# Patient Record
Sex: Female | Born: 1960 | Race: White | Hispanic: No | Marital: Married | Smoking: Former smoker
Health system: Southern US, Community
[De-identification: ages and names within clinical notes are randomized; demographics above are authoritative.]

## PROBLEM LIST (undated history)

## (undated) DIAGNOSIS — N200 Calculus of kidney: Secondary | ICD-10-CM

## (undated) DIAGNOSIS — H25019 Cortical age-related cataract, unspecified eye: Secondary | ICD-10-CM

## (undated) DIAGNOSIS — J449 Chronic obstructive pulmonary disease, unspecified: Secondary | ICD-10-CM

## (undated) DIAGNOSIS — B029 Zoster without complications: Secondary | ICD-10-CM

## (undated) DIAGNOSIS — F32A Depression, unspecified: Secondary | ICD-10-CM

## (undated) DIAGNOSIS — K219 Gastro-esophageal reflux disease without esophagitis: Secondary | ICD-10-CM

## (undated) DIAGNOSIS — J302 Other seasonal allergic rhinitis: Secondary | ICD-10-CM

## (undated) DIAGNOSIS — F329 Major depressive disorder, single episode, unspecified: Secondary | ICD-10-CM

## (undated) DIAGNOSIS — R519 Headache, unspecified: Secondary | ICD-10-CM

## (undated) DIAGNOSIS — R51 Headache: Secondary | ICD-10-CM

## (undated) DIAGNOSIS — E7800 Pure hypercholesterolemia, unspecified: Secondary | ICD-10-CM

## (undated) DIAGNOSIS — M1990 Unspecified osteoarthritis, unspecified site: Secondary | ICD-10-CM

## (undated) DIAGNOSIS — E559 Vitamin D deficiency, unspecified: Secondary | ICD-10-CM

## (undated) DIAGNOSIS — G5600 Carpal tunnel syndrome, unspecified upper limb: Secondary | ICD-10-CM

## (undated) DIAGNOSIS — Z87442 Personal history of urinary calculi: Secondary | ICD-10-CM

## (undated) HISTORY — PX: TUBAL LIGATION: SHX77

## (undated) HISTORY — DX: Calculus of kidney: N20.0

## (undated) HISTORY — PX: ABDOMINAL HYSTERECTOMY: SHX81

## (undated) HISTORY — PX: OTHER SURGICAL HISTORY: SHX169

## (undated) HISTORY — PX: COLONOSCOPY: SHX174

---

## 2008-02-24 ENCOUNTER — Ambulatory Visit: Payer: Self-pay

## 2008-03-18 ENCOUNTER — Ambulatory Visit: Payer: Self-pay

## 2008-04-06 ENCOUNTER — Ambulatory Visit: Payer: Self-pay | Admitting: Orthopaedic Surgery

## 2008-04-14 ENCOUNTER — Ambulatory Visit: Payer: Self-pay | Admitting: Orthopaedic Surgery

## 2009-02-15 ENCOUNTER — Ambulatory Visit: Payer: Self-pay

## 2009-03-21 ENCOUNTER — Ambulatory Visit: Payer: Self-pay | Admitting: Internal Medicine

## 2010-03-22 ENCOUNTER — Ambulatory Visit: Payer: Self-pay

## 2010-08-24 ENCOUNTER — Ambulatory Visit: Payer: Self-pay | Admitting: Gastroenterology

## 2011-04-03 ENCOUNTER — Ambulatory Visit: Payer: Self-pay

## 2012-04-07 ENCOUNTER — Ambulatory Visit: Payer: Self-pay

## 2013-04-13 ENCOUNTER — Ambulatory Visit: Payer: Self-pay

## 2013-10-26 ENCOUNTER — Ambulatory Visit: Payer: Self-pay | Admitting: Gastroenterology

## 2013-10-27 LAB — PATHOLOGY REPORT

## 2014-03-03 ENCOUNTER — Ambulatory Visit: Payer: Self-pay | Admitting: Emergency Medicine

## 2014-04-14 ENCOUNTER — Ambulatory Visit: Payer: Self-pay

## 2014-07-19 ENCOUNTER — Other Ambulatory Visit: Payer: Self-pay | Admitting: Rheumatology

## 2014-07-19 LAB — BODY FLUID CELL COUNT WITH DIFFERENTIAL
BASOS ABS: 0 %
Eosinophil: 0 %
Lymphocytes: 41 %
NUCLEATED CELL COUNT: 290 /mm3
Neutrophils: 12 %
OTHER CELLS BF: 0 %
Other Mononuclear Cells: 47 %

## 2014-07-19 LAB — SYNOVIAL FLUID, CRYSTAL: CRYSTALS, JOINT FLUID: NONE SEEN

## 2015-03-21 ENCOUNTER — Other Ambulatory Visit: Payer: Self-pay | Admitting: Obstetrics and Gynecology

## 2015-03-21 DIAGNOSIS — Z1231 Encounter for screening mammogram for malignant neoplasm of breast: Secondary | ICD-10-CM

## 2015-04-19 ENCOUNTER — Ambulatory Visit
Admission: RE | Admit: 2015-04-19 | Discharge: 2015-04-19 | Disposition: A | Discharge status: Home / Self Care | Payer: Managed Care, Other (non HMO) | Source: Ambulatory Visit | Attending: Obstetrics and Gynecology | Admitting: Obstetrics and Gynecology

## 2015-04-19 DIAGNOSIS — Z1231 Encounter for screening mammogram for malignant neoplasm of breast: Secondary | ICD-10-CM | POA: Diagnosis not present

## 2016-04-01 IMAGING — CR DG FOOT COMPLETE 3+V*L*
1 series · 3 of 3 positions shown · non-contrast
Comparison: None.

CLINICAL DATA: Pain and swelling after twisting the foot.

EXAM:
LEFT FOOT - COMPLETE 3+ VIEW

[Series 1: ap · 0.17mm/px · 3 of 3 slices shown]
[im 1/3]
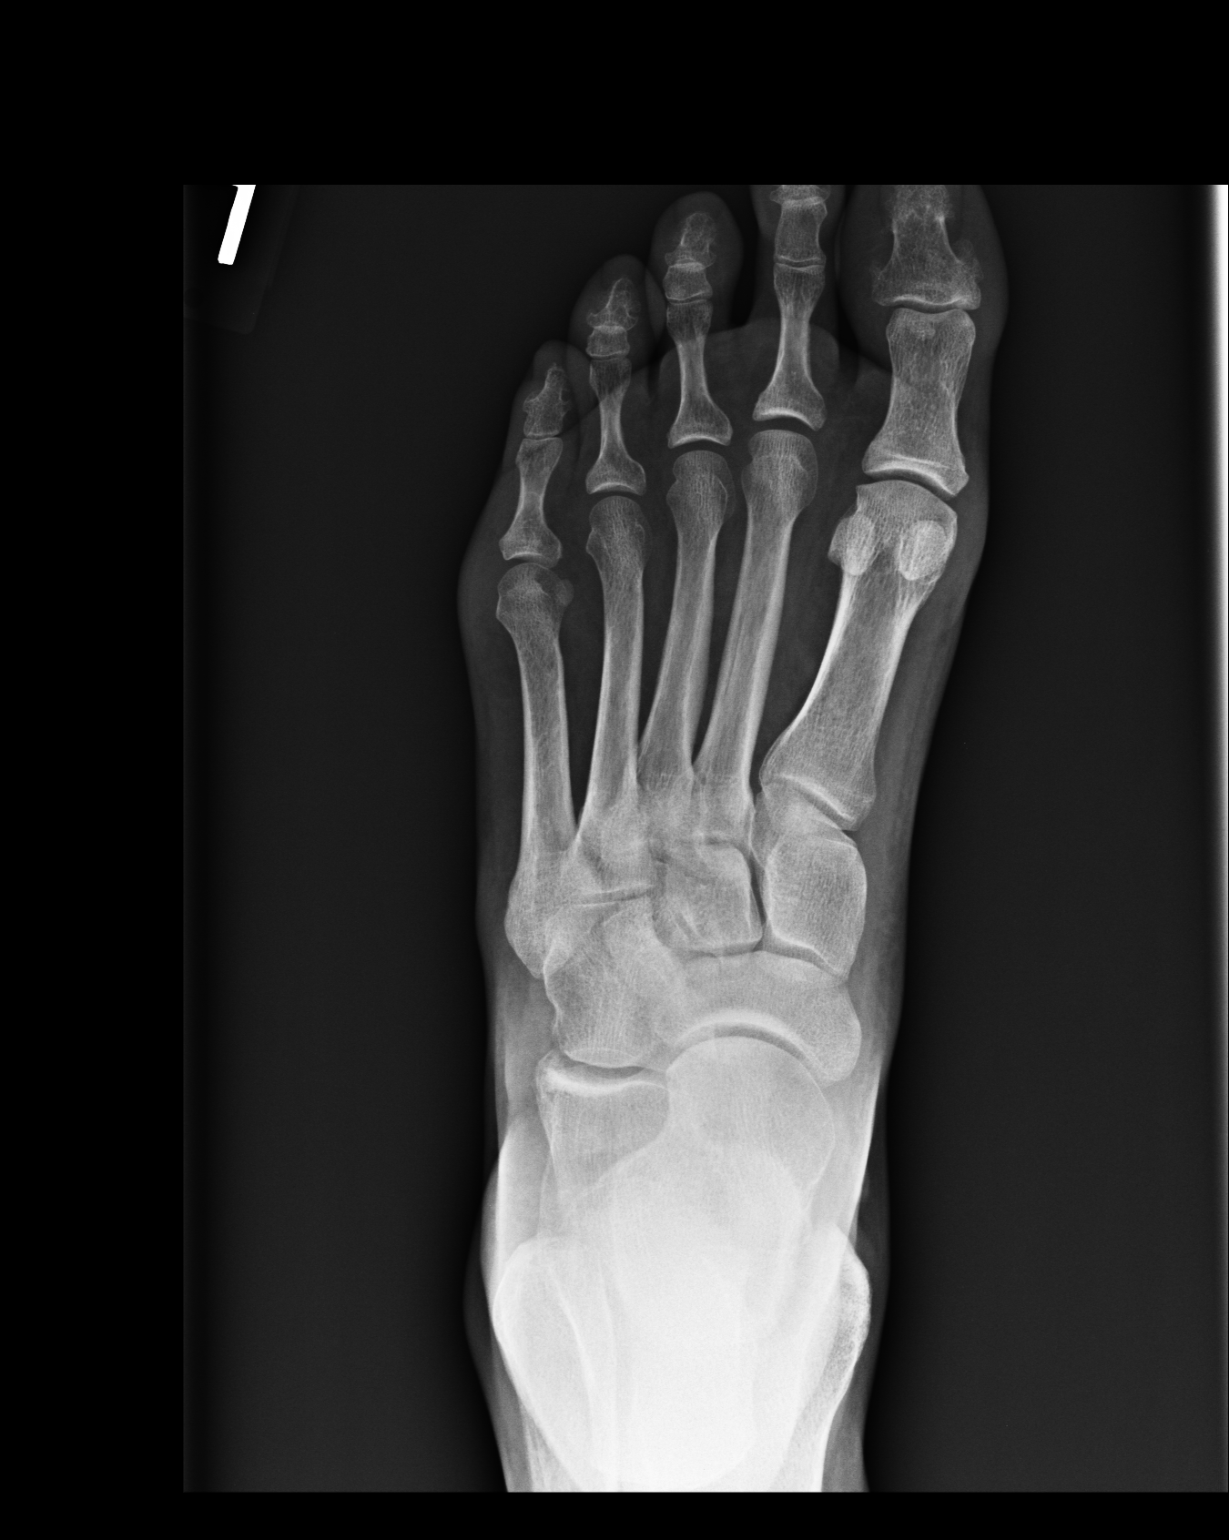
[im 2/3]
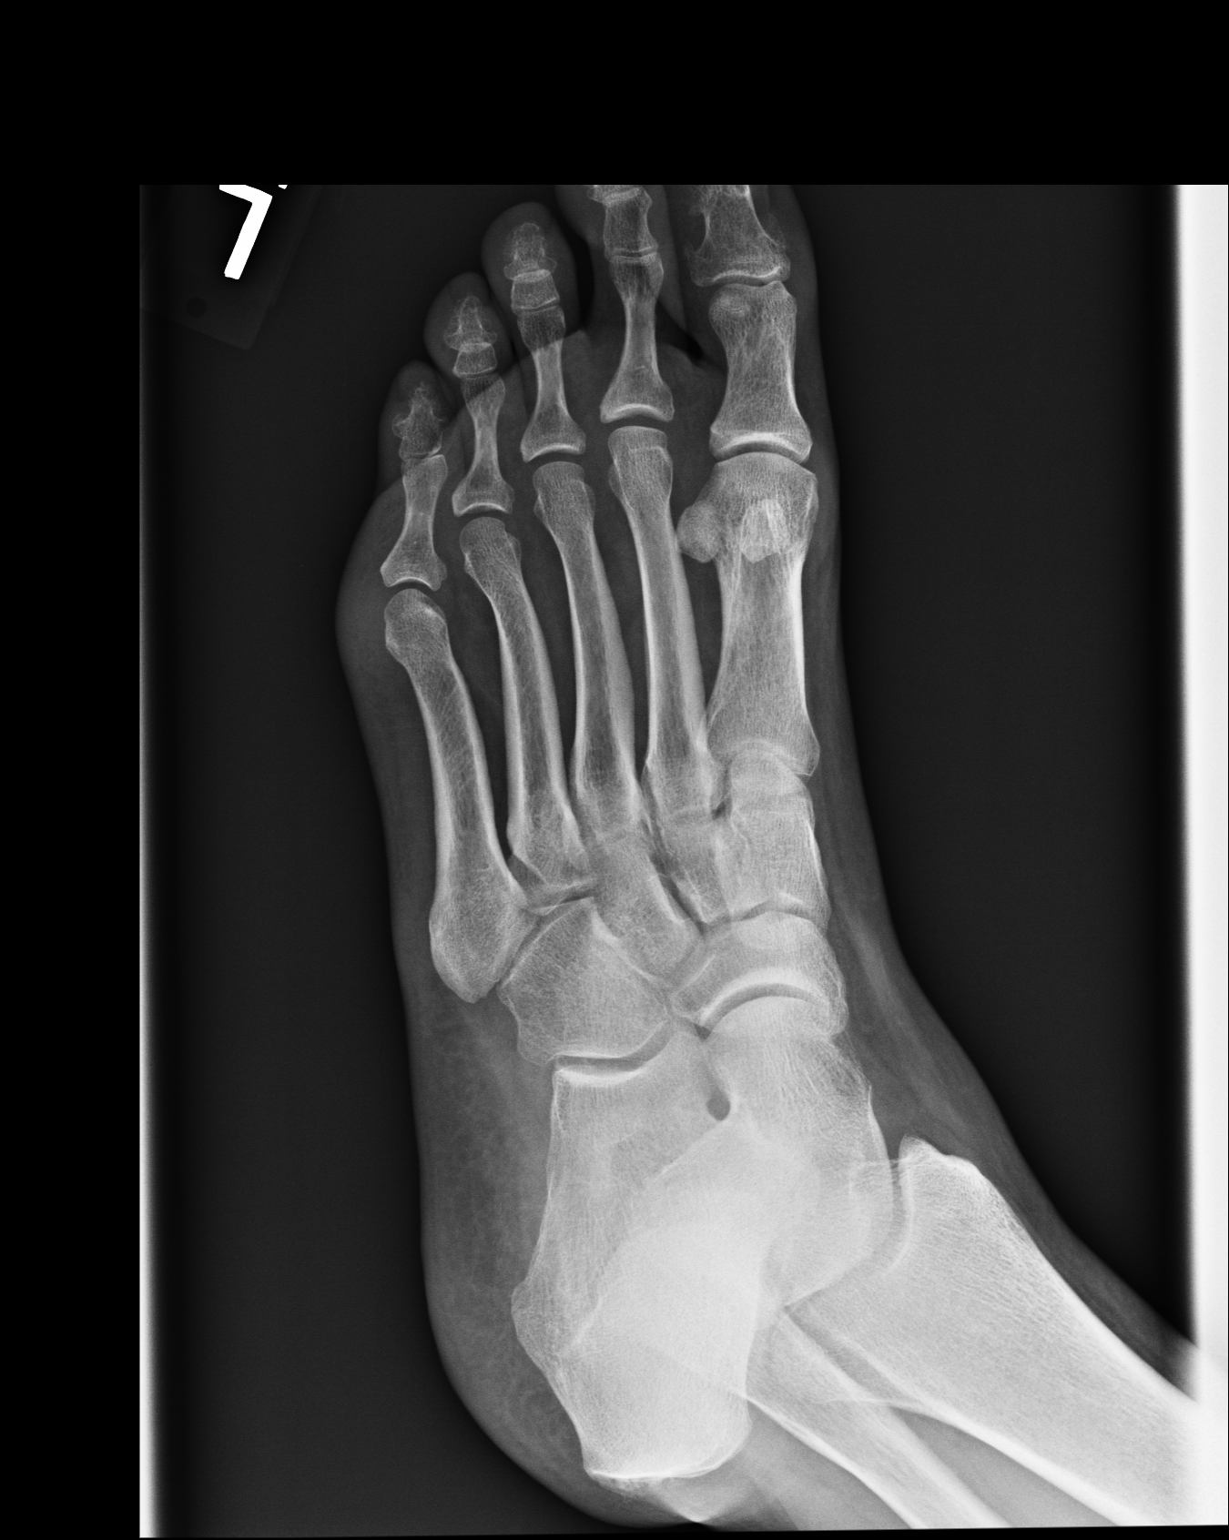
[im 3/3]
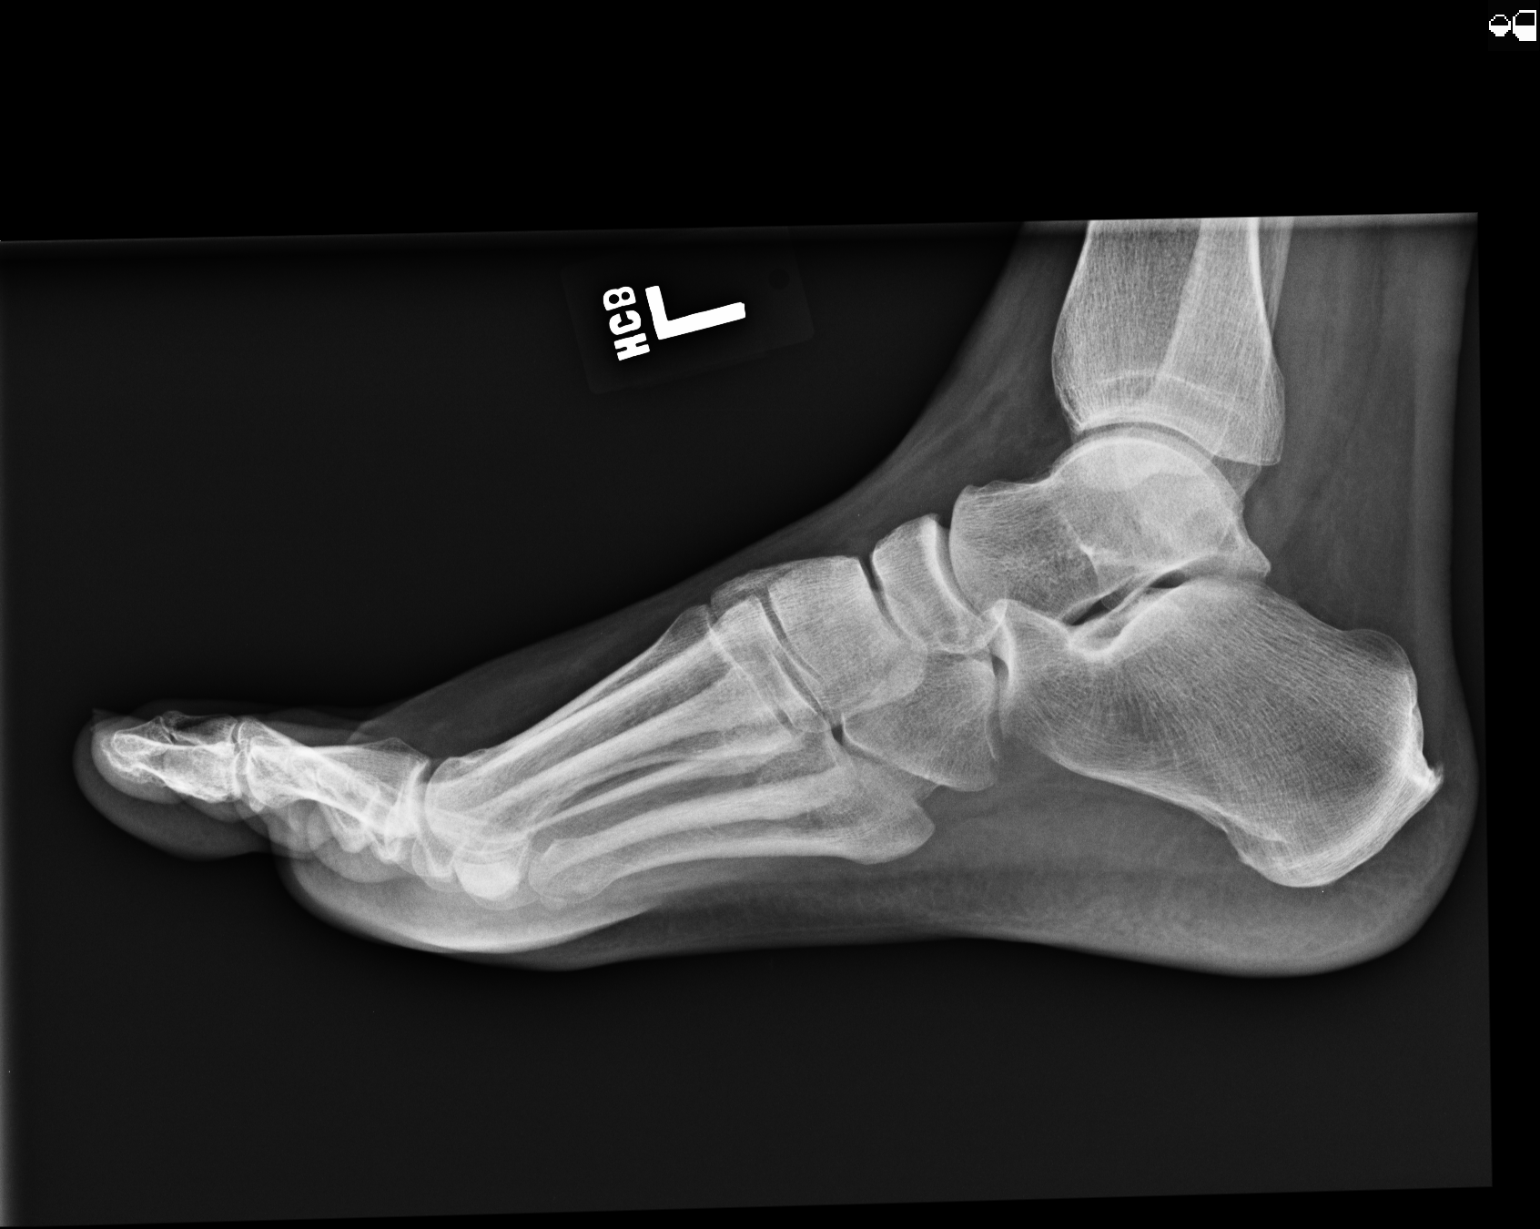

[3 of 3 positions shown; findings below may reference images not displayed]

FINDINGS: There is no evidence of fracture or dislocation. There is no
evidence of arthropathy or other focal bone abnormality. Soft
tissues are unremarkable.
IMPRESSION: Negative.

## 2016-04-01 IMAGING — CR DG ANKLE COMPLETE 3+V*L*
1 series · 3 of 3 positions shown · non-contrast
Comparison: None.

CLINICAL DATA: Twisted ankle.

EXAM:
LEFT ANKLE COMPLETE - 3+ VIEW

[Series 1: ap · 0.17mm/px · 3 of 3 slices shown]
[im 1/3]
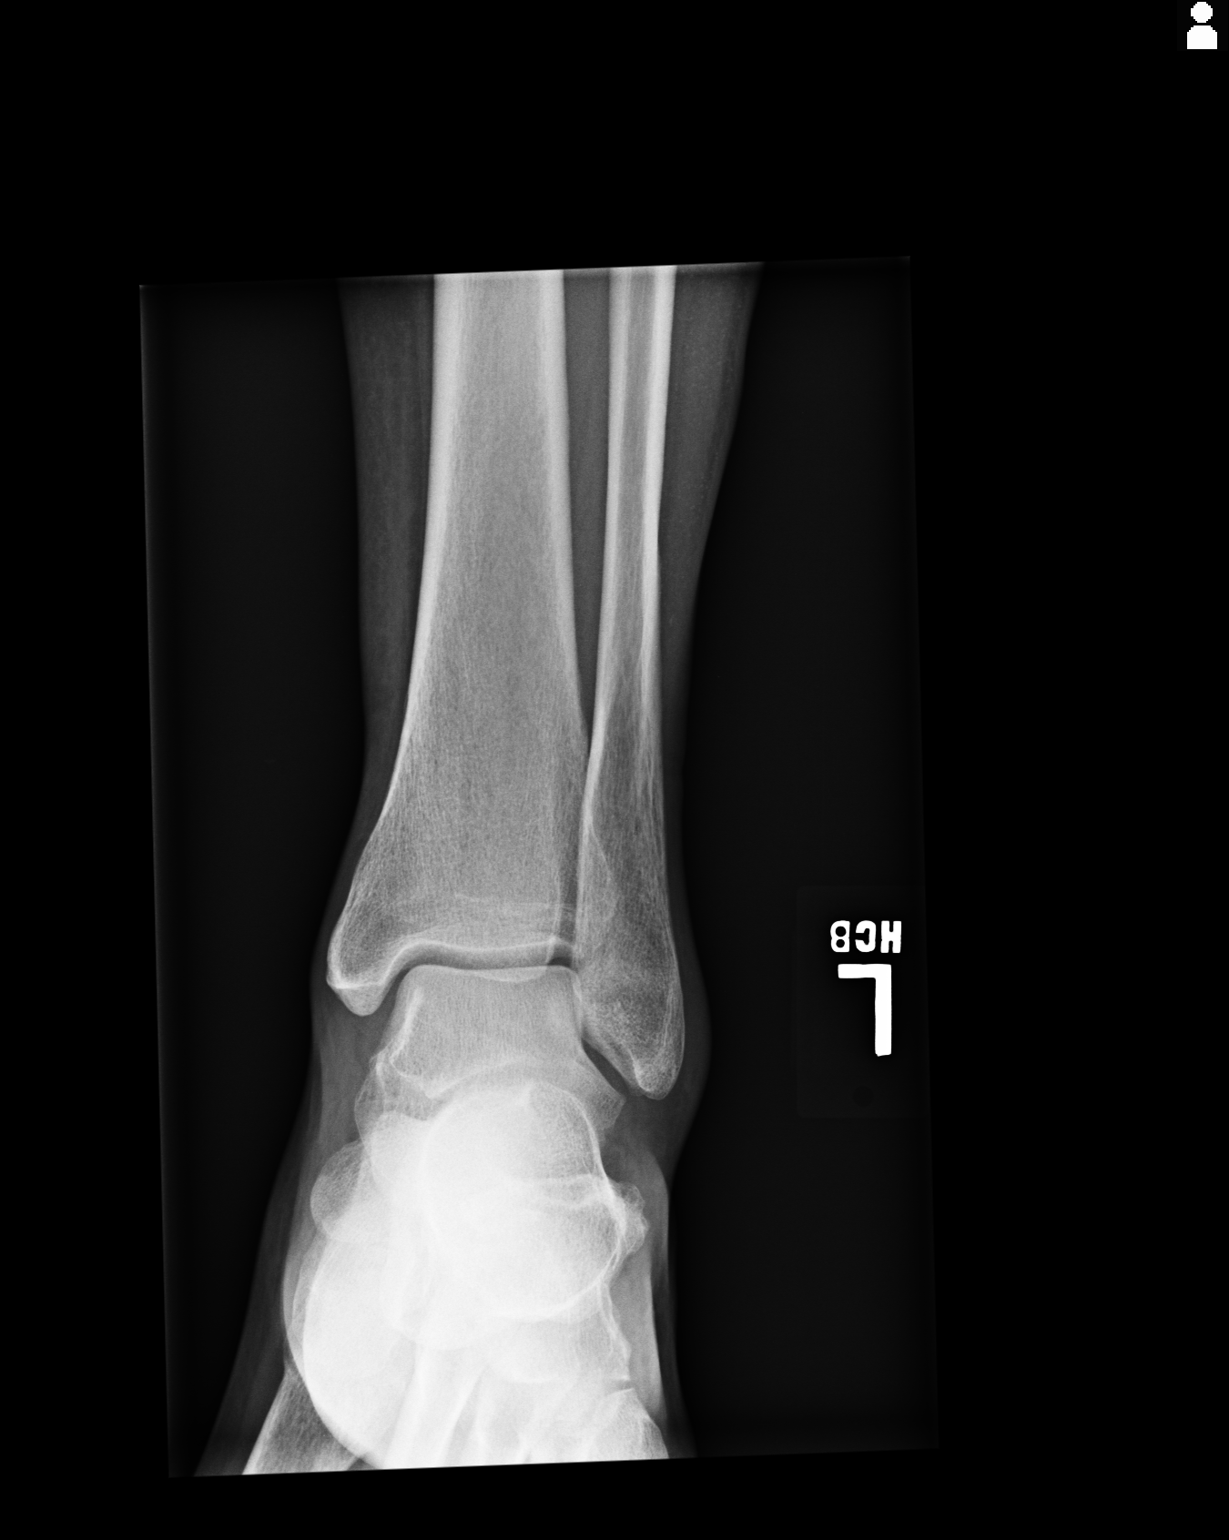
[im 2/3]
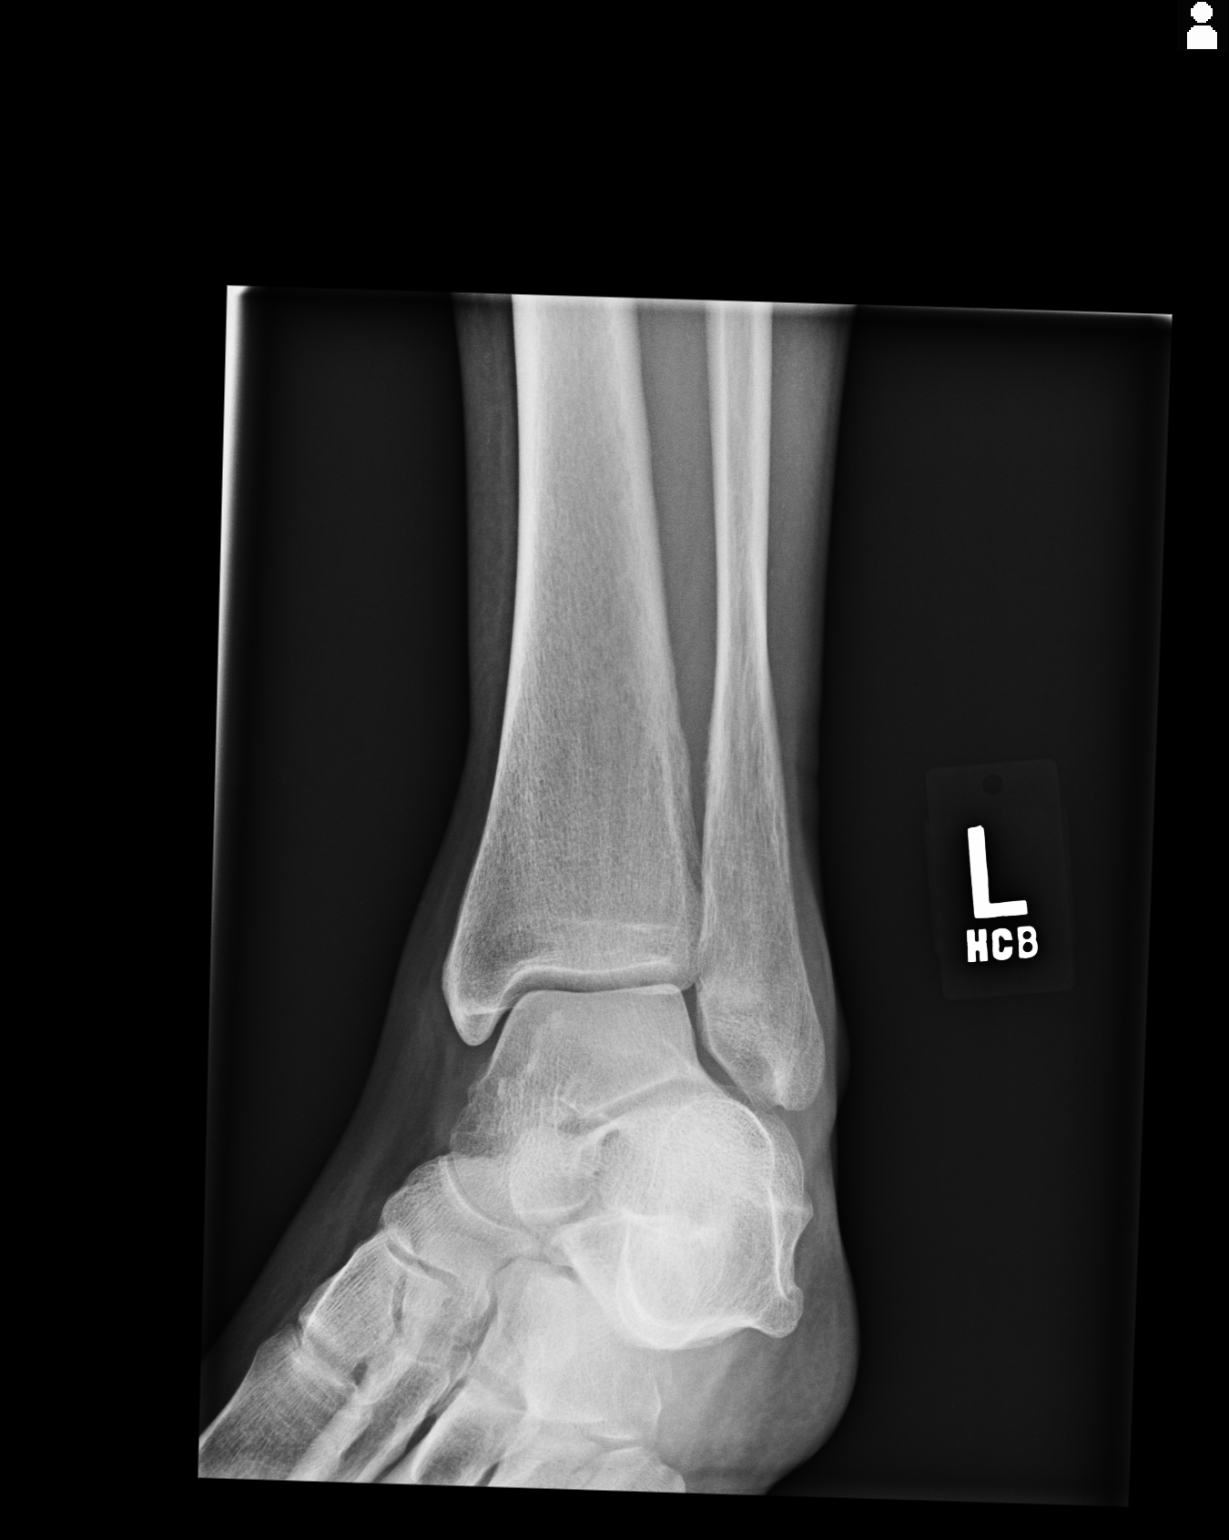
[im 3/3]
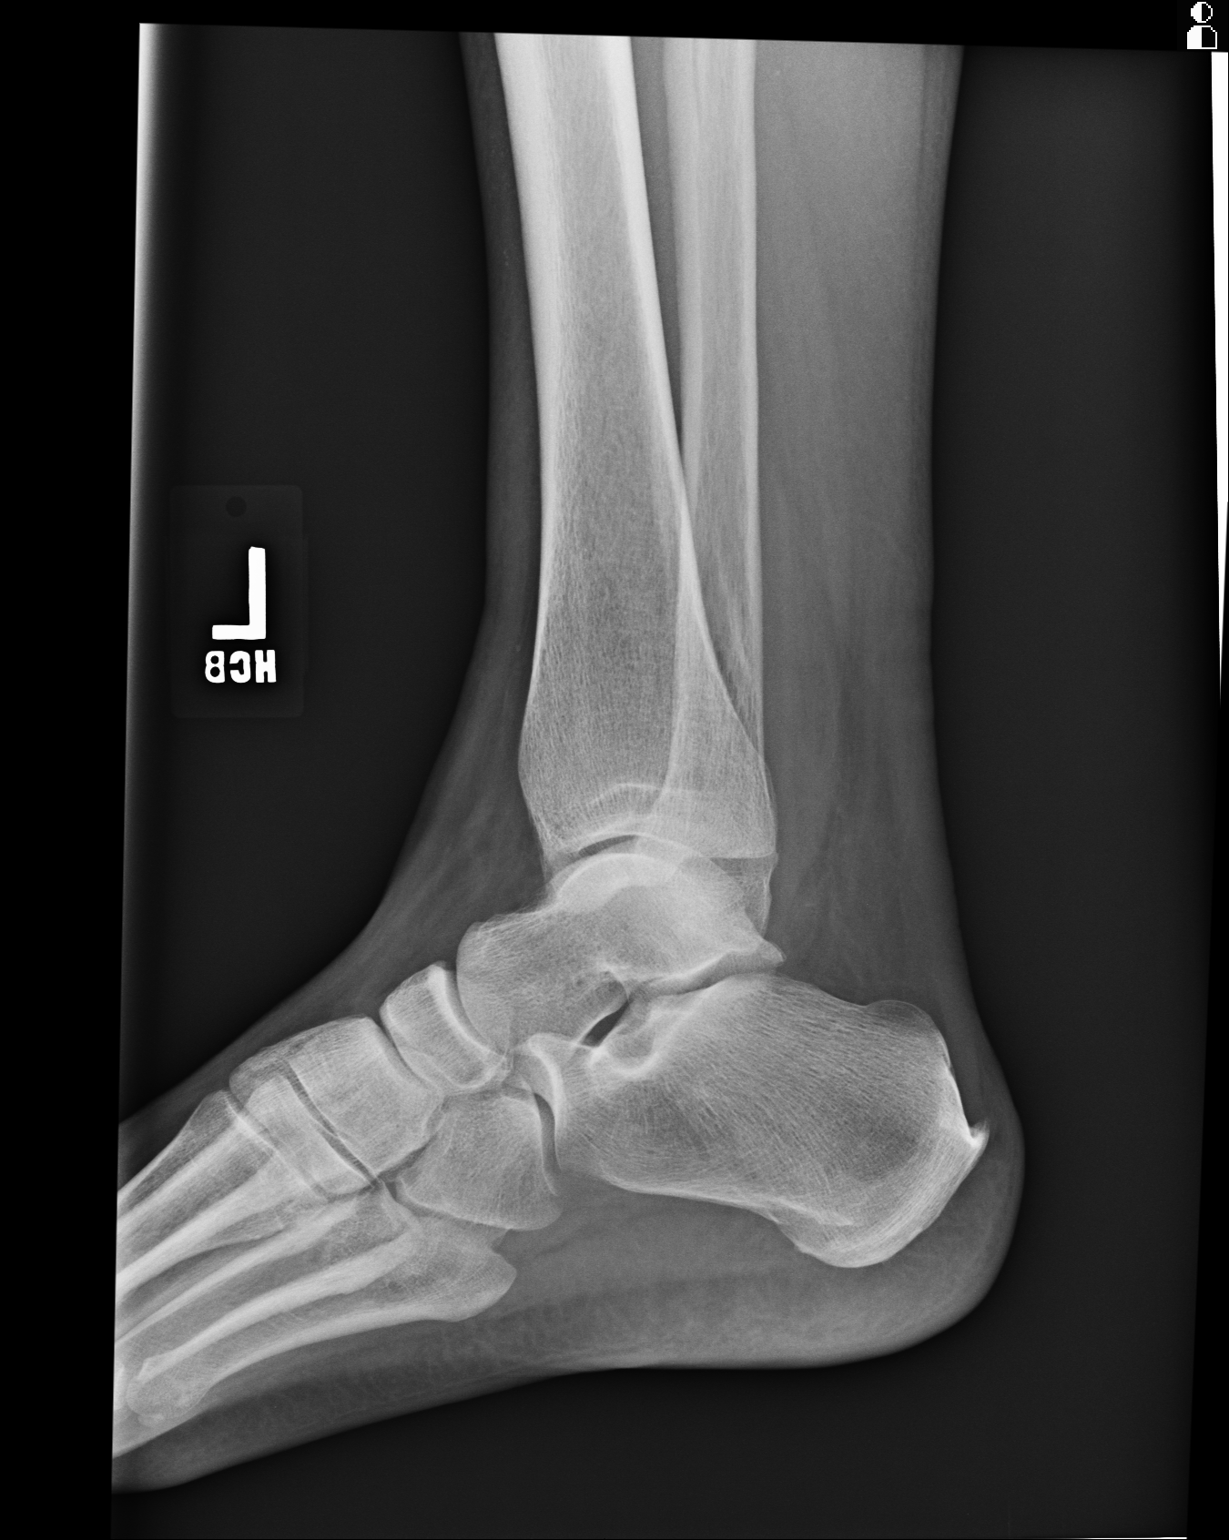

[3 of 3 positions shown; findings below may reference images not displayed]

FINDINGS: The ankle mortise is maintained. No acute ankle fracture or
osteochondral lesion. No definite joint effusion. The mid and
hindfoot bony structures are intact.
IMPRESSION: No acute bony findings.

## 2016-11-27 ENCOUNTER — Other Ambulatory Visit: Payer: Self-pay | Admitting: Family Medicine

## 2016-11-27 DIAGNOSIS — M25431 Effusion, right wrist: Secondary | ICD-10-CM

## 2016-12-02 ENCOUNTER — Ambulatory Visit
Admission: RE | Admit: 2016-12-02 | Discharge: 2016-12-02 | Disposition: A | Discharge status: Home / Self Care | Payer: Commercial Managed Care - PPO | Source: Ambulatory Visit | Attending: Family Medicine | Admitting: Family Medicine

## 2016-12-02 ENCOUNTER — Other Ambulatory Visit: Payer: Self-pay | Admitting: Family Medicine

## 2016-12-02 DIAGNOSIS — M25431 Effusion, right wrist: Secondary | ICD-10-CM

## 2016-12-02 DIAGNOSIS — M67431 Ganglion, right wrist: Secondary | ICD-10-CM | POA: Diagnosis not present

## 2018-01-14 ENCOUNTER — Other Ambulatory Visit: Payer: Self-pay | Admitting: Family Medicine

## 2018-01-14 DIAGNOSIS — Z1231 Encounter for screening mammogram for malignant neoplasm of breast: Secondary | ICD-10-CM

## 2018-10-12 ENCOUNTER — Encounter: Payer: Self-pay | Admitting: *Deleted

## 2018-10-12 ENCOUNTER — Ambulatory Visit: Payer: BLUE CROSS/BLUE SHIELD | Admitting: Anesthesiology

## 2018-10-12 ENCOUNTER — Encounter
Admission: RE | Disposition: A | Discharge status: Home / Self Care | Payer: Self-pay | Source: Home / Self Care | Attending: Gastroenterology

## 2018-10-12 ENCOUNTER — Ambulatory Visit
Admission: RE | Admit: 2018-10-12 | Discharge: 2018-10-12 | Disposition: A | Discharge status: Home / Self Care | Payer: BLUE CROSS/BLUE SHIELD | Attending: Gastroenterology | Admitting: Gastroenterology

## 2018-10-12 DIAGNOSIS — E7800 Pure hypercholesterolemia, unspecified: Secondary | ICD-10-CM | POA: Diagnosis not present

## 2018-10-12 DIAGNOSIS — Z86010 Personal history of colonic polyps: Secondary | ICD-10-CM | POA: Insufficient documentation

## 2018-10-12 DIAGNOSIS — K5730 Diverticulosis of large intestine without perforation or abscess without bleeding: Secondary | ICD-10-CM | POA: Diagnosis not present

## 2018-10-12 DIAGNOSIS — M1990 Unspecified osteoarthritis, unspecified site: Secondary | ICD-10-CM | POA: Diagnosis not present

## 2018-10-12 DIAGNOSIS — D123 Benign neoplasm of transverse colon: Secondary | ICD-10-CM | POA: Diagnosis not present

## 2018-10-12 DIAGNOSIS — Z87891 Personal history of nicotine dependence: Secondary | ICD-10-CM | POA: Diagnosis not present

## 2018-10-12 DIAGNOSIS — K621 Rectal polyp: Secondary | ICD-10-CM | POA: Insufficient documentation

## 2018-10-12 DIAGNOSIS — Z79899 Other long term (current) drug therapy: Secondary | ICD-10-CM | POA: Diagnosis not present

## 2018-10-12 DIAGNOSIS — Z888 Allergy status to other drugs, medicaments and biological substances status: Secondary | ICD-10-CM | POA: Diagnosis not present

## 2018-10-12 DIAGNOSIS — Z791 Long term (current) use of non-steroidal anti-inflammatories (NSAID): Secondary | ICD-10-CM | POA: Diagnosis not present

## 2018-10-12 DIAGNOSIS — Z800 Family history of malignant neoplasm of digestive organs: Secondary | ICD-10-CM | POA: Diagnosis present

## 2018-10-12 DIAGNOSIS — D120 Benign neoplasm of cecum: Secondary | ICD-10-CM | POA: Insufficient documentation

## 2018-10-12 DIAGNOSIS — Z886 Allergy status to analgesic agent status: Secondary | ICD-10-CM | POA: Insufficient documentation

## 2018-10-12 HISTORY — DX: Zoster without complications: B02.9

## 2018-10-12 HISTORY — DX: Major depressive disorder, single episode, unspecified: F32.9

## 2018-10-12 HISTORY — DX: Depression, unspecified: F32.A

## 2018-10-12 HISTORY — DX: Cortical age-related cataract, unspecified eye: H25.019

## 2018-10-12 HISTORY — DX: Other seasonal allergic rhinitis: J30.2

## 2018-10-12 HISTORY — DX: Unspecified osteoarthritis, unspecified site: M19.90

## 2018-10-12 HISTORY — PX: COLONOSCOPY WITH PROPOFOL: SHX5780

## 2018-10-12 HISTORY — DX: Headache, unspecified: R51.9

## 2018-10-12 HISTORY — DX: Headache: R51

## 2018-10-12 HISTORY — DX: Gastro-esophageal reflux disease without esophagitis: K21.9

## 2018-10-12 HISTORY — DX: Pure hypercholesterolemia, unspecified: E78.00

## 2018-10-12 HISTORY — DX: Vitamin D deficiency, unspecified: E55.9

## 2018-10-12 HISTORY — DX: Carpal tunnel syndrome, unspecified upper limb: G56.00

## 2018-10-12 SURGERY — COLONOSCOPY WITH PROPOFOL
Anesthesia: General

## 2018-10-12 MED ORDER — PROPOFOL 500 MG/50ML IV EMUL
INTRAVENOUS | Status: DC | PRN
  Administered 2018-10-12: 120 ug/kg/min via INTRAVENOUS

## 2018-10-12 MED ORDER — SODIUM CHLORIDE 0.9 % IV SOLN
INTRAVENOUS | Status: DC
  Administered 2018-10-12: 13:00:00 via INTRAVENOUS

## 2018-10-12 MED ORDER — PROPOFOL 10 MG/ML IV BOLUS
INTRAVENOUS | Status: DC | PRN
  Administered 2018-10-12: 90 mg via INTRAVENOUS

## 2018-10-12 MED ORDER — SODIUM CHLORIDE 0.9 % IV SOLN: INTRAVENOUS | Status: DC

## 2018-10-12 MED ORDER — PROPOFOL 500 MG/50ML IV EMUL
INTRAVENOUS | Status: AC
  Filled 2018-10-12: qty 50

## 2018-10-12 NOTE — Transfer of Care (Signed)
Immediate Anesthesia Transfer of Care Note  Patient: CALISA LUCKENBAUGH  Procedure(s) Performed: COLONOSCOPY WITH PROPOFOL (N/A )  Patient Location: Endoscopy Unit  Anesthesia Type:General  Level of Consciousness: drowsy and patient cooperative  Airway & Oxygen Therapy: Patient Spontanous Breathing and Patient connected to nasal cannula oxygen  Post-op Assessment: Report given to RN and Post -op Vital signs reviewed and stable  Post vital signs: Reviewed and stable  Last Vitals:  Vitals Value Taken Time  BP 127/73 10/12/2018  2:41 PM  Temp 37.1 C 10/12/2018  2:31 PM  Pulse 82 10/12/2018  2:35 PM  Resp 18 10/12/2018  2:42 PM  SpO2 97 % 10/12/2018  2:35 PM  Vitals shown include unvalidated device data.  Last Pain:  Vitals:   10/12/18 1431  TempSrc: Oral  PainSc: 0-No pain         Complications: No apparent anesthesia complications

## 2018-10-12 NOTE — Anesthesia Post-op Follow-up Note (Signed)
Anesthesia QCDR form completed.        

## 2018-10-12 NOTE — Anesthesia Postprocedure Evaluation (Signed)
Anesthesia Post Note  Patient: Angela Lucas  Procedure(s) Performed: COLONOSCOPY WITH PROPOFOL (N/A )  Patient location during evaluation: PACU Anesthesia Type: General Level of consciousness: awake and alert Pain management: pain level controlled Vital Signs Assessment: post-procedure vital signs reviewed and stable Respiratory status: spontaneous breathing, nonlabored ventilation, respiratory function stable and patient connected to nasal cannula oxygen Cardiovascular status: blood pressure returned to baseline and stable Postop Assessment: no apparent nausea or vomiting Anesthetic complications: no     Last Vitals:  Vitals:   10/12/18 1441 10/12/18 1450  BP: 127/73 136/80  Pulse:    Resp: 17 17  Temp:    SpO2: 100% 99%    Last Pain:  Vitals:   10/12/18 1450  TempSrc:   PainSc: 0-No pain                 Durenda Hurt

## 2018-10-12 NOTE — Op Note (Signed)
[facility] Gastroenterology Patient Name: Angela Lucas Procedure Date: 10/12/2018 1:57 PM MRN: [id number] Account #: [id number] Date of Birth: [birth year] Admit Type: Outpatient Age: 57 Room: [facility] ENDO ROOM 3 Gender: Female Note Status: Finalized Procedure:            Colonoscopy Indications:          Family history of colon cancer in a first-degree                        relative, Personal history of colonic polyps Providers:            Lollie Sails, MD Referring MD:         Sofie Hartigan (Referring MD) Medicines:            Monitored Anesthesia Care Complications:        No immediate complications. Procedure:            Pre-Anesthesia Assessment:                       - ASA Grade Assessment: II - A patient with mild                        systemic disease.                       After obtaining informed consent, the colonoscope was                        passed under direct vision. Throughout the procedure,                        the patient's blood pressure, pulse, and oxygen                        saturations were monitored continuously. The                        Colonoscope was introduced through the anus and                        advanced to the the cecum, identified by appendiceal                        orifice and ileocecal valve. The colonoscopy was                        performed with moderate difficulty due to significant                        looping. Successful completion of the procedure was                        aided by using manual pressure. Findings:      Multiple small and large-mouthed diverticula were found in the sigmoid       colon, descending colon, transverse colon and ascending colon.      A 3 mm polyp was found in the rectum. The polyp was sessile. The polyp       was removed with a cold biopsy forceps. Resection and retrieval were       complete.  A 8 mm polyp was found in the proximal transverse colon. The polyp  was       sessile. The polyp was removed with a cold snare. Resection and       retrieval were complete.      A 7 mm polyp was found in the cecum. The polyp was sessile. The polyp       was removed with a cold snare. Resection and retrieval were complete.      The retroflexed view of the distal rectum and anal verge was normal and       showed no anal or rectal abnormalities.      The digital rectal exam was normal. Impression:           - Diverticulosis in the sigmoid colon, in the                        descending colon, in the transverse colon and in the                        ascending colon.                       - One 3 mm polyp in the rectum, removed with a cold                        biopsy forceps. Resected and retrieved.                       - One 8 mm polyp in the proximal transverse colon,                        removed with a cold snare. Resected and retrieved.                       - One 7 mm polyp in the cecum, removed with a cold                        snare. Resected and retrieved.                       - The distal rectum and anal verge are normal on                        retroflexion view. Recommendation:       - Discharge patient to home.                       - Soft diet today, then advance as tolerated to advance                        diet as tolerated. Procedure Code(s):    --- Professional ---                       [phone], Colonoscopy, flexible; with removal of tumor(s),                        polyp(s), or other lesion(s) by snare technique                       [id number], 59, Colonoscopy,  flexible; with biopsy, single                        or multiple Diagnosis Code(s):    --- Professional ---                       K62.1, Rectal polyp                       D12.3, Benign neoplasm of transverse colon (hepatic                        flexure or splenic flexure)                       D12.0, Benign neoplasm of cecum                       Z80.0, Family history of  malignant neoplasm of                        digestive organs                       Z86.010, Personal history of colonic polyps                       K57.30, Diverticulosis of large intestine without                        perforation or abscess without bleeding CPT copyright 2018 American Medical Association. All rights reserved. The codes documented in this report are preliminary and upon coder review may  be revised to meet current compliance requirements. Lollie Sails, MD 10/12/2018 2:29:50 PM This report has been signed electronically. Number of Addenda: 0 Note Initiated On: 10/12/2018 1:57 PM Scope Withdrawal Time: 0 hours 7 minutes 27 seconds  Total Procedure Duration: 0 hours 19 minutes 51 seconds       [facility]

## 2018-10-12 NOTE — H&P (Signed)
Outpatient short stay form Pre-procedure 10/12/2018 1:49 PM Lollie Sails MD  Primary Physician: Alanson Aly, NP  Reason for visit: Colonoscopy  History of present illness: Patient is a 57 year old female presenting today for colonoscopy.  There is a personal history of adenomatous colon polyps as well as family history of colon cancer in a primary relative.  She tolerated her prep well.  She takes no aspirin or blood thinning agent.    Current Facility-Administered Medications:  .  0.9 %  sodium chloride infusion, , Intravenous, Continuous, Lollie Sails, MD, Last Rate: 20 mL/hr at 10/12/18 1256 .  0.9 %  sodium chloride infusion, , Intravenous, Continuous, Lollie Sails, MD  Medications Prior to Admission  Medication Sig Dispense Refill Last Dose  . acetaminophen (TYLENOL) 325 MG tablet Take 650 mg by mouth every 6 (six) hours as needed.   10/11/2018 at Unknown time  . Cholecalciferol 25 MCG (1000 UT) capsule Take 1,000 Units by mouth daily.   Past Week at Unknown time  . diclofenac sodium (VOLTAREN) 1 % GEL Apply topically 4 (four) times daily.   Past Week at Unknown time  . methylcellulose oral powder Take by mouth daily.   Past Week at Unknown time  . pravastatin (PRAVACHOL) 20 MG tablet Take 20 mg by mouth daily.   10/11/2018 at Unknown time  . valACYclovir (VALTREX) 500 MG tablet Take 500 mg by mouth 2 (two) times daily.   Past Month at Unknown time  . diclofenac (VOLTAREN) 0.1 % ophthalmic solution 4 (four) times daily.   Not Taking at Unknown time     Allergies  Allergen Reactions  . Aspirin   . Nsaids   . Prednisone   . Simvastatin   . Sulfasalazine   . Celebrex [Celecoxib]     Abd. pain     Past Medical History:  Diagnosis Date  . Arthritis   . Carpal tunnel syndrome   . Cataract cortical, senile   . Depression   . GERD (gastroesophageal reflux disease)   . Headache   . Hypercholesterolemia   . Seasonal allergies   . Shingles   . Vitamin  D deficiency     Review of systems:      Physical Exam    Heart and lungs: Rhythm without rub or gallop, lungs are bilaterally clear.    HEENT: Normocephalic atraumatic eyes are anicteric    Other:    Pertinant exam for procedure: Soft nontender nondistended bowel sounds positive normoactive.    Planned proceedures: Colonoscopy and indicated procedures. I have discussed the risks benefits and complications of procedures to include not limited to bleeding, infection, perforation and the risk of sedation and the patient wishes to proceed.    Lollie Sails, MD Gastroenterology 10/12/2018  1:49 PM

## 2018-10-12 NOTE — Anesthesia Preprocedure Evaluation (Signed)
Anesthesia Evaluation  Patient identified by MRN, date of birth, ID band Patient awake    Reviewed: Allergy & Precautions, H&P , NPO status , Patient's Chart, lab work & pertinent test results  Airway Mallampati: III       Dental  (+) Poor Dentition, Loose, Edentulous Upper Very loose lower incisor:   Pulmonary former smoker,           Cardiovascular (-) angina(-) Past MI and (-) Cardiac Stents (-) dysrhythmias      Neuro/Psych  Headaches,  Neuromuscular disease (carpal tunnel) negative psych ROS   GI/Hepatic negative GI ROS, Neg liver ROS, GERD  ,  Endo/Other  negative endocrine ROS  Renal/GU negative Renal ROS  negative genitourinary   Musculoskeletal   Abdominal   Peds  Hematology negative hematology ROS (+)   Anesthesia Other Findings Past Medical History: No date: Arthritis No date: Carpal tunnel syndrome No date: Cataract cortical, senile No date: Depression No date: GERD (gastroesophageal reflux disease) No date: Headache No date: Hypercholesterolemia No date: Seasonal allergies No date: Shingles No date: Vitamin D deficiency  Past Surgical History: No date: ABDOMINAL HYSTERECTOMY No date: arthoscopy knee; Left No date: COLONOSCOPY No date: TUBAL LIGATION  BMI    Body Mass Index:  30.24 kg/m      Reproductive/Obstetrics negative OB ROS                             Anesthesia Physical Anesthesia Plan  ASA: II  Anesthesia Plan: General   Post-op Pain Management:    Induction:   PONV Risk Score and Plan: Propofol infusion and TIVA  Airway Management Planned:   Additional Equipment:   Intra-op Plan:   Post-operative Plan:   Informed Consent: I have reviewed the patients History and Physical, chart, labs and discussed the procedure including the risks, benefits and alternatives for the proposed anesthesia with the patient or authorized representative who has  indicated his/her understanding and acceptance.   Dental Advisory Given  Plan Discussed with: Anesthesiologist, CRNA and Surgeon  Anesthesia Plan Comments:         Anesthesia Quick Evaluation

## 2018-10-14 LAB — SURGICAL PATHOLOGY

## 2019-02-16 ENCOUNTER — Ambulatory Visit
Admission: RE | Admit: 2019-02-16 | Discharge: 2019-02-16 | Disposition: A | Discharge status: Home / Self Care | Payer: Managed Care, Other (non HMO) | Source: Ambulatory Visit | Attending: Family Medicine | Admitting: Family Medicine

## 2019-02-16 ENCOUNTER — Other Ambulatory Visit: Payer: Self-pay

## 2019-02-16 ENCOUNTER — Other Ambulatory Visit: Payer: Self-pay | Admitting: Family Medicine

## 2019-02-16 DIAGNOSIS — R509 Fever, unspecified: Secondary | ICD-10-CM | POA: Insufficient documentation

## 2019-02-16 DIAGNOSIS — R0602 Shortness of breath: Secondary | ICD-10-CM | POA: Insufficient documentation

## 2019-02-16 MED ORDER — IOHEXOL 300 MG/ML  SOLN
75.0000 mL | Freq: Once | INTRAMUSCULAR | Status: AC | PRN
  Administered 2019-02-16: 11:00:00 75 mL via INTRAVENOUS

## 2019-11-03 ENCOUNTER — Ambulatory Visit: Payer: Managed Care, Other (non HMO) | Admitting: Urology

## 2019-11-03 ENCOUNTER — Other Ambulatory Visit: Payer: Self-pay

## 2019-11-03 ENCOUNTER — Ambulatory Visit
Admission: RE | Admit: 2019-11-03 | Discharge: 2019-11-03 | Disposition: A | Discharge status: Home / Self Care | Payer: Managed Care, Other (non HMO) | Source: Ambulatory Visit | Attending: Urology | Admitting: Urology

## 2019-11-03 ENCOUNTER — Other Ambulatory Visit: Payer: Self-pay | Admitting: Urology

## 2019-11-03 ENCOUNTER — Encounter: Payer: Self-pay | Admitting: Urology

## 2019-11-03 VITALS — BP 170/89 | HR 116 | Ht 72.0 in | Wt 225.0 lb

## 2019-11-03 DIAGNOSIS — N200 Calculus of kidney: Secondary | ICD-10-CM

## 2019-11-03 DIAGNOSIS — R1032 Left lower quadrant pain: Secondary | ICD-10-CM | POA: Diagnosis not present

## 2019-11-03 DIAGNOSIS — R310 Gross hematuria: Secondary | ICD-10-CM

## 2019-11-03 MED ORDER — IOHEXOL 300 MG/ML  SOLN
100.0000 mL | Freq: Once | INTRAMUSCULAR | Status: AC | PRN
  Administered 2019-11-03: 16:00:00 100 mL via INTRAVENOUS

## 2019-11-03 NOTE — Progress Notes (Addendum)
11/03/19 2:57 PM   Hanley Hays 1961-05-23 [id number]  Referring provider: Sofie Hartigan, MD [address],  [place] [address]  CC: Left flank pain  HPI: I saw Ms. Angela Lucas in urology clinic in consultation for left flank pain from Dr. Ellison Hughs.  She is a very healthy 59 year old female who reports over a month of intermittent left-sided flank pain.  She feels it is worse when sitting.  She is also had some occasional sharp shooting pain down to the left groin.  She saw her primary care physician with worsening pain and dysuria on 10/12/2019 and a KUB was performed that suggested a 1.6 cm left renal calculus.  These images are unavailable for me to personally review.  Urine culture grew Klebsiella and she was treated with antibiotics at that time with improvement of her UTI symptoms of burning.  She did have some malaise at that time, but denies any fevers or chills.  She denies any history of nephrolithiasis.  She had a small amount of blood in her urine when she was having some flank pain last week.  She also has had some urinary urgency, frequency, and urge incontinence.   PMH: Past Medical History:  Diagnosis Date  . Arthritis   . Carpal tunnel syndrome   . Cataract cortical, senile   . Depression   . GERD (gastroesophageal reflux disease)   . Headache   . Hypercholesterolemia   . Seasonal allergies   . Shingles   . Vitamin D deficiency     Surgical History: Past Surgical History:  Procedure Laterality Date  . ABDOMINAL HYSTERECTOMY    . arthoscopy knee Left   . COLONOSCOPY    . COLONOSCOPY WITH PROPOFOL N/A 10/12/2018   Procedure: COLONOSCOPY WITH PROPOFOL;  Surgeon: Lollie Sails, MD;  Location: [facility] ENDOSCOPY;  Service: Endoscopy;  Laterality: N/A;  . TUBAL LIGATION      Allergies:  Allergies  Allergen Reactions  . Aspirin   . Nsaids   . Prednisone   . Simvastatin   . Sulfasalazine   . Celebrex [Celecoxib]     Abd. pain    Family  History: Family History  Problem Relation Age of Onset  . Breast cancer Maternal Grandmother 50    Social History:  reports that she quit smoking about 11 years ago. She has never used smokeless tobacco. She reports that she does not drink alcohol or use drugs.  ROS: Please see flowsheet from today's date for complete review of systems.  Physical Exam: BP (!) 170/89   Pulse (!) 116   Ht 6' (1.829 m)   Wt 225 lb (102.1 kg)   BMI 30.52 kg/m    Constitutional:  Alert and oriented, No acute distress. Cardiovascular: No clubbing, cyanosis, or edema. Respiratory: Normal respiratory effort, no increased work of breathing. GI: Abdomen is soft, nontender, nondistended, no abdominal masses GU: Left CVA tenderness Lymph: No cervical or inguinal lymphadenopathy. Skin: No rashes, bruises or suspicious lesions. Neurologic: Grossly intact, no focal deficits, moving all 4 extremities. Psychiatric: Normal mood and affect.  Laboratory Data: Reviewed, see HPI  Urinalysis today greater than 30 WBCs, 0 RBCs, no bacteria, no yeast, nitrite negative  Pertinent Imaging: Reviewed, see HPI Unable to obtain outside KUB images to personally review, but report states possible 1.6 cm left renal calculus  Assessment & Plan:   In summary, the patient is a healthy 59 year old female with over a month of left-sided flank pain and groin pain, as well  as a culture positive Klebsiella UTI on 10/12/2019.  Outside KUB from 12/15 reportedly shows a large 1.6 cm left calculus.  We discussed the need for further evaluation with CT to determine stone size, density, and location.  We discussed various treatment options for urolithiasis including observation with or without medical expulsive therapy, shockwave lithotripsy (SWL), ureteroscopy and laser lithotripsy with stent placement, and percutaneous nephrolithotomy.  We discussed that management is based on stone size, location, density, patient co-morbidities, and  patient preference.   Stones <48mm in size have a >80% spontaneous passage rate. Data surrounding the use of tamsulosin for medical expulsive therapy is controversial, but meta analyses suggests it is most efficacious for distal stones between 5-58mm in size. Possible side effects include dizziness/lightheadedness, and retrograde ejaculation.  SWL has a lower stone free rate in a single procedure, but also a lower complication rate compared to ureteroscopy and avoids a stent and associated stent related symptoms. Possible complications include renal hematoma, steinstrasse, and need for additional treatment.  Ureteroscopy with laser lithotripsy and stent placement has a higher stone free rate than SWL in a single procedure, however increased complication rate including possible infection, ureteral injury, bleeding, and stent related morbidity. Common stent related symptoms include dysuria, urgency/frequency, and flank pain.  PCNL is the favored treatment for stones >2cm. It involves a small incision in the flank, with complete fragmentation of stones and removal. It has the highest stone free rate, but also the highest complication rate. Possible complications include bleeding, infection/sepsis, injury to surrounding organs including the pleura, and collecting system injury.   CT ordered, will call with results  Addendum: CT shows a 1 cm nonobstructing left lower pole stone.  There is no hydronephrosis.  We discussed that this may not be the cause of her left-sided flank and abdominal pain, however may be a causative factor with her UTIs.  She desires treatment of the stone.  There is also a 2 cm hypodense subcapsular liver lesion, and will forward to her primary Dr. Ellison Hughs for further evaluation with likely MR.  We will schedule left ureteroscopy, laser lithotripsy, stent placement Follow-up urine culture  A total of 40 minutes were spent face-to-face with the patient, greater than 50% was spent  in patient education, counseling, and coordination of care regarding nephrolithiasis and treatment options.   Billey Co, [facility] [address] [place], [place] [address] [phone]

## 2019-11-03 NOTE — Addendum Note (Signed)
Addended by: Billey Co on: 11/03/2019 03:24 PM   Modules accepted: Orders

## 2019-11-05 LAB — URINALYSIS, COMPLETE
Bilirubin, UA: NEGATIVE
Glucose, UA: NEGATIVE
Ketones, UA: NEGATIVE
Nitrite, UA: NEGATIVE
Protein,UA: NEGATIVE
Specific Gravity, UA: 1.025 (ref 1.005–1.030)
Urobilinogen, Ur: 0.2 mg/dL (ref 0.2–1.0)
pH, UA: 5.5 (ref 5.0–7.5)

## 2019-11-05 LAB — MICROSCOPIC EXAMINATION
Bacteria, UA: NONE SEEN
WBC, UA: >30 /hpf — AB (ref 0–5)

## 2019-11-07 LAB — CULTURE, URINE COMPREHENSIVE

## 2019-11-08 ENCOUNTER — Telehealth: Payer: Self-pay | Admitting: *Deleted

## 2019-11-08 MED ORDER — CIPROFLOXACIN HCL 500 MG PO TABS: 500.0000 mg | ORAL_TABLET | Freq: Two times a day (BID) | ORAL | 0 refills | Status: DC

## 2019-11-08 NOTE — Telephone Encounter (Signed)
Can do cipro 500mg  BID x 7 days, thanks  Nickolas Madrid, MD 11/08/2019

## 2019-11-08 NOTE — Telephone Encounter (Addendum)
-----   Message from Billey Co, MD sent at 11/08/2019  8:16 AM EST ----- Bactrim DS BID x 7 days for infection in urine, need to sterilize urine before surgery   Nickolas Madrid, MD 11/08/2019

## 2019-11-08 NOTE — Telephone Encounter (Signed)
Patient informed-RX sent in to pharmacy requested.  Voiced understanding.

## 2019-11-09 ENCOUNTER — Telehealth: Payer: Self-pay | Admitting: Urology

## 2019-11-09 NOTE — Telephone Encounter (Signed)
She is having ureteroscopy and laser lithotripsy for a renal stone. Can return to work next day without restrictions, but some patients will want to take a few days off and we can write a work note if needed.  Please discuss stent related symptoms of urgency, frequency, dysuria, flank pain with her.   Nickolas Madrid, MD 11/09/2019

## 2019-11-09 NOTE — Telephone Encounter (Signed)
Called pt no answer. Generic message left for pt explaining the information below. Information sent via mychart as well.

## 2019-11-09 NOTE — Telephone Encounter (Signed)
Pt would like a call back to discuss after surgery care and how long she will be out of work, etc..

## 2019-11-12 ENCOUNTER — Other Ambulatory Visit: Payer: Self-pay | Admitting: Urology

## 2019-11-12 DIAGNOSIS — N200 Calculus of kidney: Secondary | ICD-10-CM

## 2019-11-15 ENCOUNTER — Other Ambulatory Visit: Payer: Self-pay | Admitting: Family Medicine

## 2019-11-15 ENCOUNTER — Other Ambulatory Visit: Payer: Self-pay

## 2019-11-15 ENCOUNTER — Other Ambulatory Visit: Payer: Managed Care, Other (non HMO)

## 2019-11-15 DIAGNOSIS — K7689 Other specified diseases of liver: Secondary | ICD-10-CM

## 2019-11-15 DIAGNOSIS — Z1231 Encounter for screening mammogram for malignant neoplasm of breast: Secondary | ICD-10-CM

## 2019-11-15 DIAGNOSIS — N200 Calculus of kidney: Secondary | ICD-10-CM

## 2019-11-15 LAB — URINALYSIS, COMPLETE
Bilirubin, UA: NEGATIVE
Glucose, UA: NEGATIVE
Ketones, UA: NEGATIVE
Leukocytes,UA: NEGATIVE
Nitrite, UA: NEGATIVE
Protein,UA: NEGATIVE
RBC, UA: NEGATIVE
Specific Gravity, UA: <1.005 — ABNORMAL LOW (ref 1.005–1.030)
Urobilinogen, Ur: 0.2 mg/dL (ref 0.2–1.0)
pH, UA: 5 (ref 5.0–7.5)

## 2019-11-15 LAB — MICROSCOPIC EXAMINATION
Bacteria, UA: NONE SEEN
RBC, Urine: NONE SEEN /hpf (ref 0–2)

## 2019-11-17 LAB — CULTURE, URINE COMPREHENSIVE

## 2019-11-23 ENCOUNTER — Encounter: Payer: Self-pay | Admitting: Urology

## 2019-11-23 ENCOUNTER — Other Ambulatory Visit: Payer: Self-pay

## 2019-11-23 ENCOUNTER — Encounter
Admission: RE | Admit: 2019-11-23 | Discharge: 2019-11-23 | Disposition: A | Discharge status: Home / Self Care | Payer: Managed Care, Other (non HMO) | Source: Ambulatory Visit | Attending: Urology | Admitting: Urology

## 2019-11-23 NOTE — Patient Instructions (Signed)
Your procedure is scheduled [id number] 1/29 Report to Day Surgery. To find out your arrival time please call [phone] between 1PM - 3PM on Thurs 1/28.  Remember: Instructions that are not followed completely may result in serious medical risk,  up to and including death, or upon the discretion of your surgeon and anesthesiologist your  surgery may need to be rescheduled.     _X__ 1. Do not eat food after midnight the night before your procedure.                 No gum chewing or hard candies. You may drink clear liquids up to 2 hours                 before you are scheduled to arrive for your surgery- DO not drink clear                 liquids within 2 hours of the start of your surgery.                 Clear Liquids include:  water, apple juice without pulp, clear carbohydrate                 drink such as Clearfast of Gatorade, Black Coffee or Tea (Do not add                 anything to coffee or tea).  __X__2.  On the morning of surgery brush your teeth with toothpaste and water, you                may rinse your mouth with mouthwash if you wish.  Do not swallow any toothpaste of mouthwash.     ___ 3.  No Alcohol for 24 hours before or after surgery.   __ 4.  Do Not Smoke or use e-cigarettes For 24 Hours Prior to Your Surgery.                 Do not use any chewable tobacco products for at least 6 hours prior to                 surgery.  ____  5.  Bring all medications with you on the day of surgery if instructed.   __x__  6.  Notify your doctor if there is any change in your medical condition      (cold, fever, infections).     Do not wear jewelry, make-up, hairpins, clips or nail polish. Do not wear lotions, powders, or perfumes. You may wear deodorant. Do not shave 48 hours prior to surgery. Men may shave face and neck. Do not bring valuables to the hospital.    [facility] is not responsible for any belongings or valuables.  Contacts, dentures or bridgework may not be  worn into surgery. Leave your suitcase in the car. After surgery it may be brought to your room. For patients admitted to the hospital, discharge time is determined by your treatment team.   Patients discharged the day of surgery will not be allowed to drive home.   Please read over the following fact sheets that you were given:    ____ Take these medicines the morning of surgery with A SIP OF WATER:    1. none  2.   3.   4.  5.  6.  ____ Fleet Enema (as directed)   _x___ Shower the night before and the morning of surgery  ____ Use inhalers on  the day of surgery  ____ Stop metformin 2 days prior to surgery    ____ Take 1/2 of usual insulin dose the night before surgery. No insulin the morning          of surgery.   ____ Stop Coumadin/Plavix/aspirin on   __x__ Stop Anti-inflammatories  diclofenac sodium (VOLTAREN) 1 % GEL   May take tylenol but no ibuprofen aspirin of aleve until after surgery   ____ Stop supplements until after surgery.    ____ Bring C-Pap to the hospital.

## 2019-11-24 ENCOUNTER — Other Ambulatory Visit
Admission: RE | Admit: 2019-11-24 | Discharge: 2019-11-24 | Disposition: A | Discharge status: Home / Self Care | Payer: Managed Care, Other (non HMO) | Source: Ambulatory Visit | Attending: Urology | Admitting: Urology

## 2019-11-24 DIAGNOSIS — Z01812 Encounter for preprocedural laboratory examination: Secondary | ICD-10-CM | POA: Diagnosis present

## 2019-11-24 DIAGNOSIS — Z20822 Contact with and (suspected) exposure to covid-19: Secondary | ICD-10-CM | POA: Insufficient documentation

## 2019-11-24 LAB — SARS CORONAVIRUS 2 (TAT 6-24 HRS): SARS Coronavirus 2: NEGATIVE

## 2019-11-26 ENCOUNTER — Ambulatory Visit: Payer: Managed Care, Other (non HMO)

## 2019-11-26 ENCOUNTER — Other Ambulatory Visit: Payer: Self-pay

## 2019-11-26 ENCOUNTER — Ambulatory Visit
Admission: RE | Admit: 2019-11-26 | Discharge: 2019-11-26 | Disposition: A | Discharge status: Home / Self Care | Payer: Managed Care, Other (non HMO) | Source: Ambulatory Visit | Attending: Urology | Admitting: Urology

## 2019-11-26 ENCOUNTER — Encounter: Payer: Self-pay | Admitting: Urology

## 2019-11-26 ENCOUNTER — Encounter
Admission: RE | Disposition: A | Discharge status: Home / Self Care | Payer: Self-pay | Source: Ambulatory Visit | Attending: Urology

## 2019-11-26 DIAGNOSIS — J449 Chronic obstructive pulmonary disease, unspecified: Secondary | ICD-10-CM | POA: Insufficient documentation

## 2019-11-26 DIAGNOSIS — M1990 Unspecified osteoarthritis, unspecified site: Secondary | ICD-10-CM | POA: Insufficient documentation

## 2019-11-26 DIAGNOSIS — Z79899 Other long term (current) drug therapy: Secondary | ICD-10-CM | POA: Diagnosis not present

## 2019-11-26 DIAGNOSIS — N200 Calculus of kidney: Secondary | ICD-10-CM | POA: Diagnosis present

## 2019-11-26 DIAGNOSIS — E7800 Pure hypercholesterolemia, unspecified: Secondary | ICD-10-CM | POA: Insufficient documentation

## 2019-11-26 DIAGNOSIS — K219 Gastro-esophageal reflux disease without esophagitis: Secondary | ICD-10-CM | POA: Insufficient documentation

## 2019-11-26 HISTORY — PX: CYSTOSCOPY W/ RETROGRADES: SHX1426

## 2019-11-26 HISTORY — DX: Chronic obstructive pulmonary disease, unspecified: J44.9

## 2019-11-26 HISTORY — DX: Personal history of urinary calculi: Z87.442

## 2019-11-26 HISTORY — PX: CYSTOSCOPY/URETEROSCOPY/HOLMIUM LASER/STENT PLACEMENT: SHX6546

## 2019-11-26 SURGERY — CYSTOSCOPY/URETEROSCOPY/HOLMIUM LASER/STENT PLACEMENT
Anesthesia: General | Site: Ureter | Laterality: Left

## 2019-11-26 MED ORDER — FENTANYL CITRATE (PF) 100 MCG/2ML IJ SOLN
INTRAMUSCULAR | Status: AC
  Filled 2019-11-26: qty 2

## 2019-11-26 MED ORDER — IOHEXOL 180 MG/ML  SOLN
INTRAMUSCULAR | Status: DC | PRN
  Administered 2019-11-26: 40 mL

## 2019-11-26 MED ORDER — CEPHALEXIN 500 MG PO CAPS: 500.0000 mg | ORAL_CAPSULE | Freq: Every day | ORAL | 0 refills | Status: AC

## 2019-11-26 MED ORDER — MIDAZOLAM HCL 2 MG/2ML IJ SOLN
INTRAMUSCULAR | Status: DC | PRN
  Administered 2019-11-26: 2 mg via INTRAVENOUS

## 2019-11-26 MED ORDER — PROPOFOL 10 MG/ML IV BOLUS
INTRAVENOUS | Status: DC | PRN
  Administered 2019-11-26: 150 mg via INTRAVENOUS

## 2019-11-26 MED ORDER — CIPROFLOXACIN IN D5W 400 MG/200ML IV SOLN
INTRAVENOUS | Status: AC
  Filled 2019-11-26: qty 200

## 2019-11-26 MED ORDER — ROCURONIUM BROMIDE 50 MG/5ML IV SOLN
INTRAVENOUS | Status: AC
  Filled 2019-11-26: qty 1

## 2019-11-26 MED ORDER — ONDANSETRON HCL 4 MG/2ML IJ SOLN
INTRAMUSCULAR | Status: AC
  Filled 2019-11-26: qty 2

## 2019-11-26 MED ORDER — HYDROCODONE-ACETAMINOPHEN 5-325 MG PO TABS: 1.0000 | ORAL_TABLET | Freq: Four times a day (QID) | ORAL | 0 refills | Status: AC | PRN

## 2019-11-26 MED ORDER — LIDOCAINE HCL (PF) 2 % IJ SOLN
INTRAMUSCULAR | Status: AC
  Filled 2019-11-26: qty 5

## 2019-11-26 MED ORDER — ROCURONIUM BROMIDE 100 MG/10ML IV SOLN
INTRAVENOUS | Status: DC | PRN
  Administered 2019-11-26: 50 mg via INTRAVENOUS
  Administered 2019-11-26: 10 mg via INTRAVENOUS

## 2019-11-26 MED ORDER — FENTANYL CITRATE (PF) 100 MCG/2ML IJ SOLN
INTRAMUSCULAR | Status: DC | PRN
  Administered 2019-11-26 (×2): 50 ug via INTRAVENOUS

## 2019-11-26 MED ORDER — SUGAMMADEX SODIUM 200 MG/2ML IV SOLN
INTRAVENOUS | Status: DC | PRN
  Administered 2019-11-26: 300 mg via INTRAVENOUS

## 2019-11-26 MED ORDER — LACTATED RINGERS IV SOLN: INTRAVENOUS | Status: DC

## 2019-11-26 MED ORDER — CIPROFLOXACIN IN D5W 400 MG/200ML IV SOLN
400.0000 mg | INTRAVENOUS | Status: AC
  Administered 2019-11-26: 15:00:00 400 mg via INTRAVENOUS

## 2019-11-26 MED ORDER — OXYCODONE HCL 5 MG/5ML PO SOLN: 5.0000 mg | Freq: Once | ORAL | Status: DC | PRN

## 2019-11-26 MED ORDER — LIDOCAINE HCL (CARDIAC) PF 100 MG/5ML IV SOSY
PREFILLED_SYRINGE | INTRAVENOUS | Status: DC | PRN
  Administered 2019-11-26: 100 mg via INTRAVENOUS

## 2019-11-26 MED ORDER — FAMOTIDINE 20 MG PO TABS
20.0000 mg | ORAL_TABLET | Freq: Once | ORAL | Status: AC
  Administered 2019-11-26: 20 mg via ORAL

## 2019-11-26 MED ORDER — FENTANYL CITRATE (PF) 100 MCG/2ML IJ SOLN: 25.0000 ug | INTRAMUSCULAR | Status: DC | PRN

## 2019-11-26 MED ORDER — PROMETHAZINE HCL 25 MG/ML IJ SOLN: 6.2500 mg | INTRAMUSCULAR | Status: DC | PRN

## 2019-11-26 MED ORDER — SCOPOLAMINE 1 MG/3DAYS TD PT72
MEDICATED_PATCH | TRANSDERMAL | Status: AC
  Filled 2019-11-26: qty 1

## 2019-11-26 MED ORDER — BELLADONNA ALKALOIDS-OPIUM 16.2-60 MG RE SUPP
RECTAL | Status: AC
  Filled 2019-11-26: qty 1

## 2019-11-26 MED ORDER — SUCCINYLCHOLINE CHLORIDE 20 MG/ML IJ SOLN
INTRAMUSCULAR | Status: AC
  Filled 2019-11-26: qty 1

## 2019-11-26 MED ORDER — BELLADONNA ALKALOIDS-OPIUM 16.2-60 MG RE SUPP
RECTAL | Status: DC | PRN
  Administered 2019-11-26: 1 via RECTAL

## 2019-11-26 MED ORDER — ONDANSETRON HCL 4 MG/2ML IJ SOLN
INTRAMUSCULAR | Status: DC | PRN
  Administered 2019-11-26: 4 mg via INTRAVENOUS

## 2019-11-26 MED ORDER — SCOPOLAMINE 1 MG/3DAYS TD PT72
1.0000 | MEDICATED_PATCH | TRANSDERMAL | Status: DC
  Administered 2019-11-26: 1.5 mg via TRANSDERMAL

## 2019-11-26 MED ORDER — OXYCODONE HCL 5 MG PO TABS: 5.0000 mg | ORAL_TABLET | Freq: Once | ORAL | Status: DC | PRN

## 2019-11-26 MED ORDER — DEXAMETHASONE SODIUM PHOSPHATE 10 MG/ML IJ SOLN
INTRAMUSCULAR | Status: AC
  Filled 2019-11-26: qty 2

## 2019-11-26 MED ORDER — MIDAZOLAM HCL 2 MG/2ML IJ SOLN
INTRAMUSCULAR | Status: AC
  Filled 2019-11-26: qty 2

## 2019-11-26 MED ORDER — MEPERIDINE HCL 50 MG/ML IJ SOLN: 6.2500 mg | INTRAMUSCULAR | Status: DC | PRN

## 2019-11-26 MED ORDER — OXYBUTYNIN CHLORIDE ER 5 MG PO TB24: 5.0000 mg | ORAL_TABLET | Freq: Every day | ORAL | 0 refills | Status: DC | PRN

## 2019-11-26 MED ORDER — DEXAMETHASONE SODIUM PHOSPHATE 10 MG/ML IJ SOLN
INTRAMUSCULAR | Status: DC | PRN
  Administered 2019-11-26: 10 mg via INTRAVENOUS

## 2019-11-26 MED ORDER — FAMOTIDINE 20 MG PO TABS
ORAL_TABLET | ORAL | Status: AC
  Filled 2019-11-26: qty 1

## 2019-11-26 MED ORDER — TAMSULOSIN HCL 0.4 MG PO CAPS: 0.4000 mg | ORAL_CAPSULE | Freq: Every day | ORAL | 0 refills | Status: DC

## 2019-11-26 SURGICAL SUPPLY — 31 items
BAG DRAIN CYSTO-URO LG1000N (MISCELLANEOUS) ×3 IMPLANT
BASKET ZERO TIP 1.9FR (BASKET) ×3 IMPLANT
BRUSH SCRUB EZ 1% IODOPHOR (MISCELLANEOUS) ×3 IMPLANT
CATH URETL 5X70 OPEN END (CATHETERS) ×3 IMPLANT
CNTNR SPEC 2.5X3XGRAD LEK (MISCELLANEOUS) ×1
CONT SPEC 4OZ STER OR WHT (MISCELLANEOUS) ×2
CONTAINER SPEC 2.5X3XGRAD LEK (MISCELLANEOUS) ×1 IMPLANT
DRAPE UTILITY 15X26 TOWEL STRL (DRAPES) ×3 IMPLANT
FIBER LASER TRAC TIP (UROLOGICAL SUPPLIES) ×3 IMPLANT
GLOVE BIOGEL PI IND STRL 7.5 (GLOVE) ×1 IMPLANT
GLOVE BIOGEL PI INDICATOR 7.5 (GLOVE) ×2
GOWN STRL REUS W/ TWL LRG LVL3 (GOWN DISPOSABLE) ×1 IMPLANT
GOWN STRL REUS W/ TWL XL LVL3 (GOWN DISPOSABLE) ×1 IMPLANT
GOWN STRL REUS W/TWL LRG LVL3 (GOWN DISPOSABLE) ×2
GOWN STRL REUS W/TWL XL LVL3 (GOWN DISPOSABLE) ×2
GUIDEWIRE STR DUAL SENSOR (WIRE) ×3 IMPLANT
INFUSOR MANOMETER BAG 3000ML (MISCELLANEOUS) ×3 IMPLANT
INTRODUCER DILATOR DOUBLE (INTRODUCER) IMPLANT
KIT TURNOVER CYSTO (KITS) ×3 IMPLANT
PACK CYSTO AR (MISCELLANEOUS) ×3 IMPLANT
SET CYSTO W/LG BORE CLAMP LF (SET/KITS/TRAYS/PACK) ×3 IMPLANT
SHEATH URETERAL 12FRX35CM (MISCELLANEOUS) IMPLANT
SOL .9 NS 3000ML IRR  AL (IV SOLUTION) ×2
SOL .9 NS 3000ML IRR UROMATIC (IV SOLUTION) ×1 IMPLANT
STENT URET 6FRX24 CONTOUR (STENTS) IMPLANT
STENT URET 6FRX26 CONTOUR (STENTS) IMPLANT
STENT URET 6FRX28 CONTOUR (STENTS) ×3 IMPLANT
SURGILUBE 2OZ TUBE FLIPTOP (MISCELLANEOUS) ×3 IMPLANT
SYR 10ML LL (SYRINGE) ×3 IMPLANT
VALVE UROSEAL ADJ ENDO (VALVE) ×3 IMPLANT
WATER STERILE IRR 1000ML POUR (IV SOLUTION) ×3 IMPLANT

## 2019-11-26 NOTE — Anesthesia Procedure Notes (Signed)
Procedure Name: Intubation Date/Time: 11/26/2019 2:30 PM Performed by: Nolon Lennert, RN Pre-anesthesia Checklist: Patient identified, Patient being monitored, Timeout performed, Emergency Drugs available and Suction available Patient Re-evaluated:Patient Re-evaluated prior to induction Oxygen Delivery Method: Circle system utilized Preoxygenation: Pre-oxygenation with 100% oxygen Induction Type: IV induction Ventilation: Mask ventilation without difficulty Laryngoscope Size: Mac and 3 Grade View: Grade I Tube type: Oral Tube size: 7.0 mm Number of attempts: 1 Airway Equipment and Method: Stylet Placement Confirmation: ETT inserted through vocal cords under direct vision,  positive ETCO2 and breath sounds checked- equal and bilateral Secured at: 21 cm Tube secured with: Tape Dental Injury: Teeth and Oropharynx as per pre-operative assessment

## 2019-11-26 NOTE — Op Note (Signed)
Date of procedure: 11/26/19  Preoperative diagnosis:  1. Left 1 cm lower pole stone  Postoperative diagnosis:  1. Same  Procedure: 1. Cystoscopy, left ureteroscopy, laser lithotripsy, left retrograde pyelogram with intraoperative interpretation, left ureteral stent placement  Surgeon: Nickolas Madrid, MD  Anesthesia: General  Complications: None  Intraoperative findings:  1.  Normal cystoscopy 2.  Uncomplicated dusting of hard left lower pole stone  EBL: Minimal  Specimens: None  Drains: Left 6 French by 28 cm ureteral stent  Indication: Angela Lucas is a 59 y.o. patient with ongoing left-sided flank pain and recurrent UTIs who was found to have a 1 cm non-obstructing left lower pole stone.  We discussed at length that this may not be the etiology of her left-sided flank pain, and she elected to pursue ureteroscopy and laser lithotripsy.  After reviewing the management options for treatment, they elected to proceed with the above surgical procedure(s). We have discussed the potential benefits and risks of the procedure, side effects of the proposed treatment, the likelihood of the patient achieving the goals of the procedure, and any potential problems that might occur during the procedure or recuperation. Informed consent has been obtained.  Description of procedure:  The patient was taken to the operating room and general anesthesia was induced. SCDs were placed for DVT prophylaxis. The patient was placed in the dorsal lithotomy position, prepped and draped in the usual sterile fashion, and preoperative antibiotics(Cipro) were administered. A preoperative time-out was performed.   A 21 French rigid cystoscope was used to intubate the urethra and thorough cystoscopy was performed.  The bladder was grossly normal and the ureteral orifices were orthotopic bilaterally.  A sensor wire was advanced into the left ureteral orifice and advanced up into the kidney under fluoroscopic vision.   A single channel digital flexible ureteroscope was then advanced over the wire up into the kidney under fluoroscopic vision.  Thorough pyeloscopy was performed and showed a cluster of large stones that were black and consistent with calcium oxalate in the lower pole.  A 200 m laser fiber was advanced through the scope and the stones were methodically dusted with a 200 m laser fiber on settings of 0.5 J and 20 Hz.  Thorough inspection of the kidney at the conclusion of the procedure revealed no fragments <14mm.  A retrograde pyelogram from the proximal ureter showed no filling defects or extravasation.  The sensor wire was replaced through the scope and there were no abnormalities on pullback ureteroscopy.  A 6 French by 28 cm ureteral stent was placed fluoroscopically with a curl in the renal pelvis as well as in the bladder.  The bladder was drained and this concluded our procedure.  A belladonna suppository was placed.   Disposition: Stable to PACU  Plan: Stent removal in 1 week Keflex prophylaxis with stent in place  Nickolas Madrid, MD

## 2019-11-26 NOTE — Discharge Instructions (Signed)

## 2019-11-26 NOTE — Op Note (Deleted)
UROLOGY H&P UPDATE  Agree with prior H&P dated 11/03/2019.  59 year old female with ongoing left-sided flank pain and CT showing a 1 cm left lower pole stone with no hydronephrosis.  She has also had recurrent UTIs.  She has elected for treatment of her stone with ureteroscopy.  Cardiac: RRR Lungs: CTA bilaterally  Laterality: Left Procedure: Ureteroscopy, laser lithotripsy, stent placement  Urine: Culture 11/15/2019 showed 800 colonies mixed urogenital flora  We specifically discussed the risks ureteroscopy including bleeding, infection/sepsis, stent related symptoms including flank pain/urgency/frequency/incontinence/dysuria, ureteral injury, inability to access stone, or need for staged or additional procedures.  We discussed at length that even with treatment of her stone she may have ongoing left-sided flank pain of unclear etiology, but likely musculoskeletal in nature.  She understands these risks and would like to proceed.  We discussed observation as an option for her non-obstructing stone at length.Billey Co, MD 11/26/2019

## 2019-11-26 NOTE — H&P (Signed)
UROLOGY H&P UPDATE  Agree with prior H&P dated 11/03/2019.  59 year old female with ongoing left-sided flank pain and CT showing a 1 cm left lower pole stone with no hydronephrosis.  She has also had recurrent UTIs.  She has elected for treatment of her stone with ureteroscopy.  Cardiac: RRR Lungs: CTA bilaterally  Laterality: Left Procedure: Ureteroscopy, laser lithotripsy, stent placement  Urine: Culture 11/15/2019 showed 800 colonies mixed urogenital flora  We specifically discussed the risks ureteroscopy including bleeding, infection/sepsis, stent related symptoms including flank pain/urgency/frequency/incontinence/dysuria, ureteral injury, inability to access stone, or need for staged or additional procedures.  We discussed at length that even with treatment of her stone she may have ongoing left-sided flank pain of unclear etiology, but likely musculoskeletal in nature.  She understands these risks and would like to proceed.  We discussed observation as an option for her non-obstructing stone at length.Billey Co, MD 11/26/2019

## 2019-11-26 NOTE — Transfer of Care (Signed)
Immediate Anesthesia Transfer of Care Note  Patient: Angela Lucas  Procedure(s) Performed: CYSTOSCOPY/URETEROSCOPY/HOLMIUM LASER/STENT PLACEMENT (Left Ureter) CYSTOSCOPY WITH RETROGRADE PYELOGRAM (Left Ureter)  Patient Location: PACU  Anesthesia Type:General  Level of Consciousness: awake and alert   Airway & Oxygen Therapy: Patient Spontanous Breathing and Patient connected to face mask oxygen  Post-op Assessment: Report given to RN and Post -op Vital signs reviewed and stable  Post vital signs: Reviewed and stable  Last Vitals:  Vitals Value Taken Time  BP 155/93 11/26/19 1533  Temp 36.8 C 11/26/19 1533  Pulse 99 11/26/19 1535  Resp 18 11/26/19 1533  SpO2 100 % 11/26/19 1535  Vitals shown include unvalidated device data.  Last Pain:  Vitals:   11/26/19 1220  TempSrc: Temporal  PainSc: 4          Complications: No apparent anesthesia complications

## 2019-11-26 NOTE — Anesthesia Preprocedure Evaluation (Signed)
Anesthesia Evaluation  Patient identified by MRN, date of birth, ID band Patient awake    Reviewed: Allergy & Precautions, NPO status , Patient's Chart, lab work & pertinent test results  History of Anesthesia Complications Negative for: history of anesthetic complications  Airway Mallampati: II  TM Distance: >3 FB Neck ROM: Full    Dental  (+) Edentulous Upper, Poor Dentition   Pulmonary COPD, former smoker,    breath sounds clear to auscultation- rhonchi (-) wheezing      Cardiovascular Exercise Tolerance: Good (-) hypertension(-) CAD, (-) Past MI, (-) Cardiac Stents and (-) CABG  Rhythm:Regular Rate:Normal - Systolic murmurs and - Diastolic murmurs    Neuro/Psych  Headaches, neg Seizures PSYCHIATRIC DISORDERS Depression    GI/Hepatic Neg liver ROS, GERD  ,  Endo/Other  negative endocrine ROSneg diabetes  Renal/GU negative Renal ROS     Musculoskeletal  (+) Arthritis ,   Abdominal (+) + obese,   Peds  Hematology negative hematology ROS (+)   Anesthesia Other Findings Past Medical History: No date: Arthritis No date: Carpal tunnel syndrome No date: Cataract cortical, senile No date: COPD (chronic obstructive pulmonary disease) (HCC) No date: Depression No date: GERD (gastroesophageal reflux disease) No date: Headache No date: History of kidney stones No date: Hypercholesterolemia No date: Seasonal allergies No date: Shingles No date: Vitamin D deficiency   Reproductive/Obstetrics                             Anesthesia Physical Anesthesia Plan  ASA: II  Anesthesia Plan: General   Post-op Pain Management:    Induction: Intravenous  PONV Risk Score and Plan: 2  Airway Management Planned: Oral ETT  Additional Equipment:   Intra-op Plan:   Post-operative Plan: Extubation in OR  Informed Consent: I have reviewed the patients History and Physical, chart, labs and  discussed the procedure including the risks, benefits and alternatives for the proposed anesthesia with the patient or authorized representative who has indicated his/her understanding and acceptance.     Dental advisory given  Plan Discussed with: CRNA and Anesthesiologist  Anesthesia Plan Comments:         Anesthesia Quick Evaluation

## 2019-11-27 NOTE — Anesthesia Postprocedure Evaluation (Signed)
Anesthesia Post Note  Patient: ORIYAH WEAN  Procedure(s) Performed: CYSTOSCOPY/URETEROSCOPY/HOLMIUM LASER/STENT PLACEMENT (Left Ureter) CYSTOSCOPY WITH RETROGRADE PYELOGRAM (Left Ureter)  Patient location during evaluation: PACU Anesthesia Type: General Level of consciousness: awake and alert Pain management: pain level controlled Vital Signs Assessment: post-procedure vital signs reviewed and stable Respiratory status: spontaneous breathing, nonlabored ventilation, respiratory function stable and patient connected to nasal cannula oxygen Cardiovascular status: blood pressure returned to baseline and stable Postop Assessment: no apparent nausea or vomiting Anesthetic complications: no     Last Vitals:  Vitals:   11/26/19 1604 11/26/19 1612  BP: (!) 152/80 (!) 158/80  Pulse: 89 86  Resp: (!) 23 18  Temp:  37.1 C  SpO2: 96% 95%    Last Pain:  Vitals:   11/26/19 1612  TempSrc: Temporal  PainSc: 2                  Martha Clan

## 2019-12-01 ENCOUNTER — Other Ambulatory Visit: Payer: Self-pay

## 2019-12-01 ENCOUNTER — Encounter: Payer: Self-pay | Admitting: Urology

## 2019-12-01 ENCOUNTER — Ambulatory Visit (INDEPENDENT_AMBULATORY_CARE_PROVIDER_SITE_OTHER): Payer: Managed Care, Other (non HMO) | Admitting: Urology

## 2019-12-01 VITALS — BP 176/105 | HR 130 | Ht 72.0 in | Wt 225.0 lb

## 2019-12-01 DIAGNOSIS — N200 Calculus of kidney: Secondary | ICD-10-CM | POA: Diagnosis not present

## 2019-12-01 DIAGNOSIS — Z466 Encounter for fitting and adjustment of urinary device: Secondary | ICD-10-CM

## 2019-12-01 MED ORDER — LIDOCAINE HCL URETHRAL/MUCOSAL 2 % EX GEL
1.0000 "application " | Freq: Once | CUTANEOUS | Status: AC
  Administered 2019-12-01: 1 via URETHRAL

## 2019-12-01 NOTE — Progress Notes (Signed)
Cystoscopy Procedure Note:  Indication: Stent removal s/p left URS/LL/stent for 1.5cm lower pole stone with flank pain  After informed consent and discussion of the procedure and its risks, Angela Lucas was positioned and prepped in the standard fashion. Cystoscopy was performed with a flexible cystoscope. The stent was grasped with flexible graspers and removed in its entirety. The patient tolerated the procedure well.  Findings: Uncomplicated stent removal  She reports her left flank pain has resolved since surgery  Assessment and Plan: We discussed general stone prevention strategies including adequate hydration with goal of producing 2.5 L of urine daily, increasing citric acid intake, increasing calcium intake during high oxalate meals, minimizing animal protein, and decreasing salt intake. Information about dietary recommendations given today.   RTC 6 months KUB   Billey Co, MD 12/01/2019

## 2019-12-01 NOTE — Patient Instructions (Addendum)
Dietary Guidelines to Help Prevent Kidney Stones Kidney stones are deposits of minerals and salts that form inside your kidneys. Your risk of developing kidney stones may be greater depending on your diet, your lifestyle, the medicines you take, and whether you have certain medical conditions. Most people can reduce their chances of developing kidney stones by following the instructions below. Depending on your overall health and the type of kidney stones you tend to develop, your dietitian may give you more specific instructions. What are tips for following this plan? Reading food labels  Choose foods with "no salt added" or "low-salt" labels. Limit your sodium intake to less than 1500 mg per day.  Choose foods with calcium for each meal and snack. Try to eat about 300 mg of calcium at each meal. Foods that contain 200-500 mg of calcium per serving include: ? 8 oz (237 ml) of milk, fortified nondairy milk, and fortified fruit juice. ? 8 oz (237 ml) of kefir, yogurt, and soy yogurt. ? 4 oz (118 ml) of tofu. ? 1 oz of cheese. ? 1 cup (300 g) of dried figs. ? 1 cup (91 g) of cooked broccoli. ? 1-3 oz can of sardines or mackerel.  Most people need 1000 to 1500 mg of calcium each day. Talk to your dietitian about how much calcium is recommended for you. Shopping  Buy plenty of fresh fruits and vegetables. Most people do not need to avoid fruits and vegetables, even if they contain nutrients that may contribute to kidney stones.  When shopping for convenience foods, choose: ? Whole pieces of fruit. ? Premade salads with dressing on the side. ? Low-fat fruit and yogurt smoothies.  Avoid buying frozen meals or prepared deli foods.  Look for foods with live cultures, such as yogurt and kefir. Cooking  Do not add salt to food when cooking. Place a salt shaker on the table and allow each person to add his or her own salt to taste.  Use vegetable protein, such as beans, textured vegetable  protein (TVP), or tofu instead of meat in pasta, casseroles, and soups. Meal planning   Eat less salt, if told by your dietitian. To do this: ? Avoid eating processed or premade food. ? Avoid eating fast food.  Eat less animal protein, including cheese, meat, poultry, or fish, if told by your dietitian. To do this: ? Limit the number of times you have meat, poultry, fish, or cheese each week. Eat a diet free of meat at least 2 days a week. ? Eat only one serving each day of meat, poultry, fish, or seafood. ? When you prepare animal protein, cut pieces into small portion sizes. For most meat and fish, one serving is about the size of one deck of cards.  Eat at least 5 servings of fresh fruits and vegetables each day. To do this: ? Keep fruits and vegetables on hand for snacks. ? Eat 1 piece of fruit or a handful of berries with breakfast. ? Have a salad and fruit at lunch. ? Have two kinds of vegetables at dinner.  Limit foods that are high in a substance called oxalate. These include: ? Spinach. ? Rhubarb. ? Beets. ? Potato chips and french fries. ? Nuts.  If you regularly take a diuretic medicine, make sure to eat at least 1-2 fruits or vegetables high in potassium each day. These include: ? Avocado. ? Banana. ? Orange, prune, carrot, or tomato juice. ? Baked potato. ? Cabbage. ? Beans and split   peas. General instructions   Drink enough fluid to keep your urine clear or pale yellow. This is the most important thing you can do.  Talk to your health care provider and dietitian about taking daily supplements. Depending on your health and the cause of your kidney stones, you may be advised: ? Not to take supplements with vitamin C. ? To take a calcium supplement. ? To take a daily probiotic supplement. ? To take other supplements such as magnesium, fish oil, or vitamin B6.  Take all medicines and supplements as told by your health care provider.  Limit alcohol intake to no  more than 1 drink a day for nonpregnant women and 2 drinks a day for men. One drink equals 12 oz of beer, 5 oz of wine, or 1 oz of hard liquor.  Lose weight if told by your health care provider. Work with your dietitian to find strategies and an eating plan that works best for you. What foods are not recommended? Limit your intake of the following foods, or as told by your dietitian. Talk to your dietitian about specific foods you should avoid based on the type of kidney stones and your overall health. Grains Breads. Bagels. Rolls. Baked goods. Salted crackers. Cereal. Pasta. Vegetables Spinach. Rhubarb. Beets. Canned vegetables. Pickles. Olives. Meats and other protein foods Nuts. Nut butters. Large portions of meat, poultry, or fish. Salted or cured meats. Deli meats. Hot dogs. Sausages. Dairy Cheese. Beverages Regular soft drinks. Regular vegetable juice. Seasonings and other foods Seasoning blends with salt. Salad dressings. Canned soups. Soy sauce. Ketchup. Barbecue sauce. Canned pasta sauce. Casseroles. Pizza. Lasagna. Frozen meals. Potato chips. French fries. Summary  You can reduce your risk of kidney stones by making changes to your diet.  The most important thing you can do is drink enough fluid. You should drink enough fluid to keep your urine clear or pale yellow.  Ask your health care provider or dietitian how much protein from animal sources you should eat each day, and also how much salt and calcium you should have each day. This information is not intended to replace advice given to you by your health care provider. Make sure you discuss any questions you have with your health care provider. Document Revised: 02/03/2019 Document Reviewed: 09/24/2016 Elsevier Patient Education  2020 [employer] Ureteral Stent Implantation, Care After This sheet gives you information about how to care for yourself after your procedure. Your health care provider may also give you more  specific instructions. If you have problems or questions, contact your health care provider. What can I expect after the procedure? After the procedure, it is common to have:  Nausea.  Mild pain when you urinate. You may feel this pain in your lower back or lower abdomen. The pain should stop within a few minutes after you urinate. This may last for up to 1 week.  A small amount of blood in your urine for several days. Follow these instructions at home: Medicines  Take over-the-counter and prescription medicines only as told by your health care provider.  If you were prescribed an antibiotic medicine, take it as told by your health care provider. Do not stop taking the antibiotic even if you start to feel better.  Do not drive for 24 hours if you were given a sedative during your procedure.  Ask your health care provider if the medicine prescribed to you requires you to avoid driving or using heavy machinery. Activity  Rest as told by   your health care provider.  Avoid sitting for a long time without moving. Get up to take short walks every 1-2 hours. This is important to improve blood flow and breathing. Ask for help if you feel weak or unsteady.  Return to your normal activities as told by your health care provider. Ask your health care provider what activities are safe for you. General instructions   Watch for any blood in your urine. Call your health care provider if the amount of blood in your urine increases.  If you have a catheter: ? Follow instructions from your health care provider about taking care of your catheter and collection bag. ? Do not take baths, swim, or use a hot tub until your health care provider approves. Ask your health care provider if you may take showers. You may only be allowed to take sponge baths.  Drink enough fluid to keep your urine pale yellow.  Do not use any products that contain nicotine or tobacco, such as cigarettes, e-cigarettes, and  chewing tobacco. These can delay healing after surgery. If you need help quitting, ask your health care provider.  Keep all follow-up visits as told by your health care provider. This is important. Contact a health care provider if:  You have pain that gets worse or does not get better with medicine, especially pain when you urinate.  You have difficulty urinating.  You feel nauseous or you vomit repeatedly during a period of more than 2 days after the procedure. Get help right away if:  Your urine is dark red or has blood clots in it.  You are leaking urine (have incontinence).  The end of the stent comes out of your urethra.  You cannot urinate.  You have sudden, sharp, or severe pain in your abdomen or lower back.  You have a fever.  You have swelling or pain in your legs.  You have difficulty breathing. Summary  After the procedure, it is common to have mild pain when you urinate that goes away within a few minutes after you urinate. This may last for up to 1 week.  Watch for any blood in your urine. Call your health care provider if the amount of blood in your urine increases.  Take over-the-counter and prescription medicines only as told by your health care provider.  Drink enough fluid to keep your urine pale yellow. This information is not intended to replace advice given to you by your health care provider. Make sure you discuss any questions you have with your health care provider. Document Revised: 07/21/2018 Document Reviewed: 07/22/2018 Elsevier Patient Education  2020 [employer]  

## 2019-12-02 LAB — MICROSCOPIC EXAMINATION
Bacteria, UA: NONE SEEN
RBC, Urine: >30 /hpf — AB (ref 0–2)

## 2019-12-02 LAB — URINALYSIS, COMPLETE
Bilirubin, UA: NEGATIVE
Glucose, UA: NEGATIVE
Ketones, UA: NEGATIVE
Nitrite, UA: NEGATIVE
Specific Gravity, UA: 1.02 (ref 1.005–1.030)
Urobilinogen, Ur: 0.2 mg/dL (ref 0.2–1.0)
pH, UA: 7 (ref 5.0–7.5)

## 2020-05-23 ENCOUNTER — Other Ambulatory Visit: Payer: Self-pay

## 2020-05-23 ENCOUNTER — Ambulatory Visit
Admission: RE | Admit: 2020-05-23 | Discharge: 2020-05-23 | Disposition: A | Discharge status: Home / Self Care | Payer: Managed Care, Other (non HMO) | Source: Ambulatory Visit | Attending: Family Medicine | Admitting: Family Medicine

## 2020-05-23 DIAGNOSIS — Z1231 Encounter for screening mammogram for malignant neoplasm of breast: Secondary | ICD-10-CM | POA: Diagnosis not present

## 2020-05-31 ENCOUNTER — Ambulatory Visit
Admission: RE | Admit: 2020-05-31 | Discharge: 2020-05-31 | Disposition: A | Discharge status: Home / Self Care | Payer: Managed Care, Other (non HMO) | Attending: Urology | Admitting: Urology

## 2020-05-31 ENCOUNTER — Ambulatory Visit
Admission: RE | Admit: 2020-05-31 | Discharge: 2020-05-31 | Disposition: A | Discharge status: Home / Self Care | Payer: Managed Care, Other (non HMO) | Source: Ambulatory Visit | Attending: Urology | Admitting: Urology

## 2020-05-31 ENCOUNTER — Other Ambulatory Visit: Payer: Self-pay

## 2020-05-31 ENCOUNTER — Ambulatory Visit (INDEPENDENT_AMBULATORY_CARE_PROVIDER_SITE_OTHER): Payer: Managed Care, Other (non HMO) | Admitting: Urology

## 2020-05-31 ENCOUNTER — Encounter: Payer: Self-pay | Admitting: Urology

## 2020-05-31 VITALS — BP 136/90 | HR 90 | Ht 72.0 in | Wt 225.0 lb

## 2020-05-31 DIAGNOSIS — N200 Calculus of kidney: Secondary | ICD-10-CM | POA: Insufficient documentation

## 2020-05-31 DIAGNOSIS — Z466 Encounter for fitting and adjustment of urinary device: Secondary | ICD-10-CM | POA: Diagnosis present

## 2020-05-31 NOTE — Patient Instructions (Signed)
Dietary Guidelines to Help Prevent Kidney Stones Kidney stones are deposits of minerals and salts that form inside your kidneys. Your risk of developing kidney stones may be greater depending on your diet, your lifestyle, the medicines you take, and whether you have certain medical conditions. Most people can reduce their chances of developing kidney stones by following the instructions below. Depending on your overall health and the type of kidney stones you tend to develop, your dietitian may give you more specific instructions. What are tips for following this plan? Reading food labels  Choose foods with "no salt added" or "low-salt" labels. Limit your sodium intake to less than 1500 mg per day.  Choose foods with calcium for each meal and snack. Try to eat about 300 mg of calcium at each meal. Foods that contain 200-500 mg of calcium per serving include: ? 8 oz (237 ml) of milk, fortified nondairy milk, and fortified fruit juice. ? 8 oz (237 ml) of kefir, yogurt, and soy yogurt. ? 4 oz (118 ml) of tofu. ? 1 oz of cheese. ? 1 cup (300 g) of dried figs. ? 1 cup (91 g) of cooked broccoli. ? 1-3 oz can of sardines or mackerel.  Most people need 1000 to 1500 mg of calcium each day. Talk to your dietitian about how much calcium is recommended for you. Shopping  Buy plenty of fresh fruits and vegetables. Most people do not need to avoid fruits and vegetables, even if they contain nutrients that may contribute to kidney stones.  When shopping for convenience foods, choose: ? Whole pieces of fruit. ? Premade salads with dressing on the side. ? Low-fat fruit and yogurt smoothies.  Avoid buying frozen meals or prepared deli foods.  Look for foods with live cultures, such as yogurt and kefir. Cooking  Do not add salt to food when cooking. Place a salt shaker on the table and allow each person to add his or her own salt to taste.  Use vegetable protein, such as beans, textured vegetable  protein (TVP), or tofu instead of meat in pasta, casseroles, and soups. Meal planning   Eat less salt, if told by your dietitian. To do this: ? Avoid eating processed or premade food. ? Avoid eating fast food.  Eat less animal protein, including cheese, meat, poultry, or fish, if told by your dietitian. To do this: ? Limit the number of times you have meat, poultry, fish, or cheese each week. Eat a diet free of meat at least 2 days a week. ? Eat only one serving each day of meat, poultry, fish, or seafood. ? When you prepare animal protein, cut pieces into small portion sizes. For most meat and fish, one serving is about the size of one deck of cards.  Eat at least 5 servings of fresh fruits and vegetables each day. To do this: ? Keep fruits and vegetables on hand for snacks. ? Eat 1 piece of fruit or a handful of berries with breakfast. ? Have a salad and fruit at lunch. ? Have two kinds of vegetables at dinner.  Limit foods that are high in a substance called oxalate. These include: ? Spinach. ? Rhubarb. ? Beets. ? Potato chips and french fries. ? Nuts.  If you regularly take a diuretic medicine, make sure to eat at least 1-2 fruits or vegetables high in potassium each day. These include: ? Avocado. ? Banana. ? Orange, prune, carrot, or tomato juice. ? Baked potato. ? Cabbage. ? Beans and split   peas. General instructions   Drink enough fluid to keep your urine clear or pale yellow. This is the most important thing you can do.  Talk to your health care provider and dietitian about taking daily supplements. Depending on your health and the cause of your kidney stones, you may be advised: ? Not to take supplements with vitamin C. ? To take a calcium supplement. ? To take a daily probiotic supplement. ? To take other supplements such as magnesium, fish oil, or vitamin B6.  Take all medicines and supplements as told by your health care provider.  Limit alcohol intake to no  more than 1 drink a day for nonpregnant women and 2 drinks a day for men. One drink equals 12 oz of beer, 5 oz of wine, or 1 oz of hard liquor.  Lose weight if told by your health care provider. Work with your dietitian to find strategies and an eating plan that works best for you. What foods are not recommended? Limit your intake of the following foods, or as told by your dietitian. Talk to your dietitian about specific foods you should avoid based on the type of kidney stones and your overall health. Grains Breads. Bagels. Rolls. Baked goods. Salted crackers. Cereal. Pasta. Vegetables Spinach. Rhubarb. Beets. Canned vegetables. Pickles. Olives. Meats and other protein foods Nuts. Nut butters. Large portions of meat, poultry, or fish. Salted or cured meats. Deli meats. Hot dogs. Sausages. Dairy Cheese. Beverages Regular soft drinks. Regular vegetable juice. Seasonings and other foods Seasoning blends with salt. Salad dressings. Canned soups. Soy sauce. Ketchup. Barbecue sauce. Canned pasta sauce. Casseroles. Pizza. Lasagna. Frozen meals. Potato chips. French fries. Summary  You can reduce your risk of kidney stones by making changes to your diet.  The most important thing you can do is drink enough fluid. You should drink enough fluid to keep your urine clear or pale yellow.  Ask your health care provider or dietitian how much protein from animal sources you should eat each day, and also how much salt and calcium you should have each day. This information is not intended to replace advice given to you by your health care provider. Make sure you discuss any questions you have with your health care provider. Document Revised: 02/03/2019 Document Reviewed: 09/24/2016 Elsevier Patient Education  2020 [employer]  

## 2020-05-31 NOTE — Progress Notes (Signed)
° °  05/31/2020 3:00 PM   ELIANAH KARIS March 29, 1961 [id number]  Reason for visit: Follow up nephrolithiasis  HPI: I saw Mr. Beedle back in urology clinic for follow-up of nephrolithiasis.  She is a 59 year old female who underwent left ureteroscopy, laser lithotripsy, and stent placement in January 2021 for a symptomatic 1 cm left renal stone with intermittent flank pain.  She reports her left-sided pain is completely resolved since treatment of the stone and she denies any complaints today.  I personally reviewed her KUB today that shows a small fragment in the left lower pole, but is otherwise benign.  We discussed general stone prevention strategies including adequate hydration with goal of producing 2.5 L of urine daily, increasing citric acid intake, increasing calcium intake during high oxalate meals, minimizing animal protein, and decreasing salt intake. Information about dietary recommendations given today.   RTC 1 year with KUB prior  Billey Co, MD  [facility] [address] [place], [place] [address] [phone]

## 2020-11-22 ENCOUNTER — Other Ambulatory Visit: Payer: Self-pay | Admitting: Family Medicine

## 2020-11-22 DIAGNOSIS — R221 Localized swelling, mass and lump, neck: Secondary | ICD-10-CM

## 2020-11-27 ENCOUNTER — Other Ambulatory Visit: Payer: Self-pay

## 2020-11-27 ENCOUNTER — Ambulatory Visit
Admission: RE | Admit: 2020-11-27 | Discharge: 2020-11-27 | Disposition: A | Discharge status: Home / Self Care | Payer: Managed Care, Other (non HMO) | Source: Ambulatory Visit | Attending: Family Medicine | Admitting: Family Medicine

## 2020-11-27 DIAGNOSIS — R221 Localized swelling, mass and lump, neck: Secondary | ICD-10-CM | POA: Insufficient documentation

## 2020-12-01 ENCOUNTER — Other Ambulatory Visit (HOSPITAL_COMMUNITY): Payer: Self-pay | Admitting: Family Medicine

## 2020-12-01 ENCOUNTER — Other Ambulatory Visit: Payer: Self-pay | Admitting: Family Medicine

## 2020-12-01 DIAGNOSIS — R160 Hepatomegaly, not elsewhere classified: Secondary | ICD-10-CM

## 2020-12-14 ENCOUNTER — Other Ambulatory Visit: Payer: Self-pay

## 2020-12-14 ENCOUNTER — Ambulatory Visit
Admission: RE | Admit: 2020-12-14 | Discharge: 2020-12-14 | Disposition: A | Discharge status: Home / Self Care | Payer: Managed Care, Other (non HMO) | Source: Ambulatory Visit | Attending: Family Medicine | Admitting: Family Medicine

## 2020-12-14 DIAGNOSIS — R160 Hepatomegaly, not elsewhere classified: Secondary | ICD-10-CM | POA: Diagnosis not present

## 2020-12-14 MED ORDER — IOHEXOL 300 MG/ML  SOLN
100.0000 mL | Freq: Once | INTRAMUSCULAR | Status: AC | PRN
  Administered 2020-12-14: 100 mL via INTRAVENOUS

## 2021-05-31 ENCOUNTER — Ambulatory Visit: Payer: Self-pay | Admitting: Urology

## 2022-02-28 ENCOUNTER — Other Ambulatory Visit: Payer: Self-pay

## 2022-02-28 DIAGNOSIS — N200 Calculus of kidney: Secondary | ICD-10-CM

## 2022-03-05 ENCOUNTER — Ambulatory Visit
Admission: RE | Admit: 2022-03-05 | Discharge: 2022-03-05 | Disposition: A | Discharge status: Home / Self Care | Payer: BC Managed Care – PPO | Source: Ambulatory Visit | Attending: Urology | Admitting: Urology

## 2022-03-05 ENCOUNTER — Encounter: Payer: Self-pay | Admitting: Urology

## 2022-03-05 ENCOUNTER — Ambulatory Visit
Admission: RE | Admit: 2022-03-05 | Discharge: 2022-03-05 | Disposition: A | Discharge status: Home / Self Care | Payer: BC Managed Care – PPO | Attending: Urology | Admitting: Urology

## 2022-03-05 ENCOUNTER — Other Ambulatory Visit: Payer: Self-pay | Admitting: *Deleted

## 2022-03-05 ENCOUNTER — Ambulatory Visit: Payer: BC Managed Care – PPO | Admitting: Urology

## 2022-03-05 VITALS — BP 176/97 | HR 84 | Ht 71.0 in | Wt 239.0 lb

## 2022-03-05 DIAGNOSIS — N200 Calculus of kidney: Secondary | ICD-10-CM

## 2022-03-05 DIAGNOSIS — N3281 Overactive bladder: Secondary | ICD-10-CM

## 2022-03-05 MED ORDER — OXYBUTYNIN CHLORIDE ER 10 MG PO TB24: 10.0000 mg | ORAL_TABLET | Freq: Every day | ORAL | 11 refills | Status: AC

## 2022-03-05 NOTE — Patient Instructions (Signed)

## 2022-03-05 NOTE — Progress Notes (Signed)
? ?  03/05/2022 ?11:04 AM  ? ?Hanley Hays ?[birth year] ?[id number] ? ?Reason for visit: Left flank pain, history of nephrolithiasis, urinary urgency and urge incontinence ? ?HPI: ?61 year old female who underwent left ureteroscopy with me in January 2021 for a 1 cm left renal stone that was causing flank pain.  Her symptoms resolved after ureteroscopy.  She has had a punctate nonobstructive lower pole stone stable since that time.  She reports a few weeks of left-sided back pain when she leans back in a chair a certain way.  She has had some worsening urinary urgency and urge incontinence over the last few months.  She drinks only water during the day.  She denies any gross hematuria. ? ?I personally viewed and interpreted her KUB today, as well as the CT from February 2022 that shows a stable punctate left lower pole stone with no hydronephrosis. ? ?Suspect her urinary symptoms are secondary to overactive bladder, and I recommended a trial of medication.  Risk and benefits of oxybutynin discussed at length. ? ?Trial of oxybutynin 10 mg XL for OAB, RTC 4 to 6 weeks symptom check ? ?Billey Co, MD ? ?[facility] ?[address] ?[place] [address], [place] [address] ?[phone] ? ? ?

## 2022-04-16 ENCOUNTER — Ambulatory Visit: Payer: BC Managed Care – PPO | Admitting: Urology

## 2022-05-07 ENCOUNTER — Ambulatory Visit: Payer: BC Managed Care – PPO | Admitting: Urology

## 2022-11-04 ENCOUNTER — Telehealth: Payer: Self-pay | Admitting: *Deleted

## 2022-11-04 NOTE — Telephone Encounter (Signed)
Attempted to call pt. To discuss referral to [facility]. Left message with husband for her to return call, he stated she is currently at work. Phone number provided.

## 2023-01-09 ENCOUNTER — Other Ambulatory Visit: Payer: Self-pay | Admitting: Family Medicine

## 2023-01-09 DIAGNOSIS — N6322 Unspecified lump in the left breast, upper inner quadrant: Secondary | ICD-10-CM

## 2023-01-15 ENCOUNTER — Ambulatory Visit
Admission: RE | Admit: 2023-01-15 | Discharge: 2023-01-15 | Disposition: A | Discharge status: Home / Self Care | Payer: BC Managed Care – PPO | Source: Ambulatory Visit | Attending: Family Medicine | Admitting: Family Medicine

## 2023-01-15 DIAGNOSIS — N6322 Unspecified lump in the left breast, upper inner quadrant: Secondary | ICD-10-CM

## 2024-02-17 ENCOUNTER — Other Ambulatory Visit: Payer: Self-pay | Admitting: Family Medicine

## 2024-02-17 DIAGNOSIS — Z1231 Encounter for screening mammogram for malignant neoplasm of breast: Secondary | ICD-10-CM

## 2024-02-24 ENCOUNTER — Other Ambulatory Visit: Payer: Self-pay | Admitting: Family Medicine

## 2024-02-24 ENCOUNTER — Ambulatory Visit
Admission: RE | Admit: 2024-02-24 | Discharge: 2024-02-24 | Disposition: A | Discharge status: Home / Self Care | Source: Ambulatory Visit | Attending: Family Medicine | Admitting: Family Medicine

## 2024-02-24 DIAGNOSIS — R2242 Localized swelling, mass and lump, left lower limb: Secondary | ICD-10-CM

## 2024-02-24 DIAGNOSIS — Z1231 Encounter for screening mammogram for malignant neoplasm of breast: Secondary | ICD-10-CM | POA: Insufficient documentation

## 2024-02-25 ENCOUNTER — Ambulatory Visit
Admission: RE | Admit: 2024-02-25 | Discharge: 2024-02-25 | Disposition: A | Discharge status: Home / Self Care | Source: Ambulatory Visit | Attending: Family Medicine | Admitting: Family Medicine

## 2024-02-25 DIAGNOSIS — R2242 Localized swelling, mass and lump, left lower limb: Secondary | ICD-10-CM | POA: Insufficient documentation

## 2024-04-03 IMAGING — CR DG ABDOMEN 1V
3 series · 3 of 3 positions shown · non-contrast
Comparison: May 31, 2020

CLINICAL DATA: Assess for kidney stone.

EXAM:
ABDOMEN - 1 VIEW

[abdomen kub (1 of 3)]
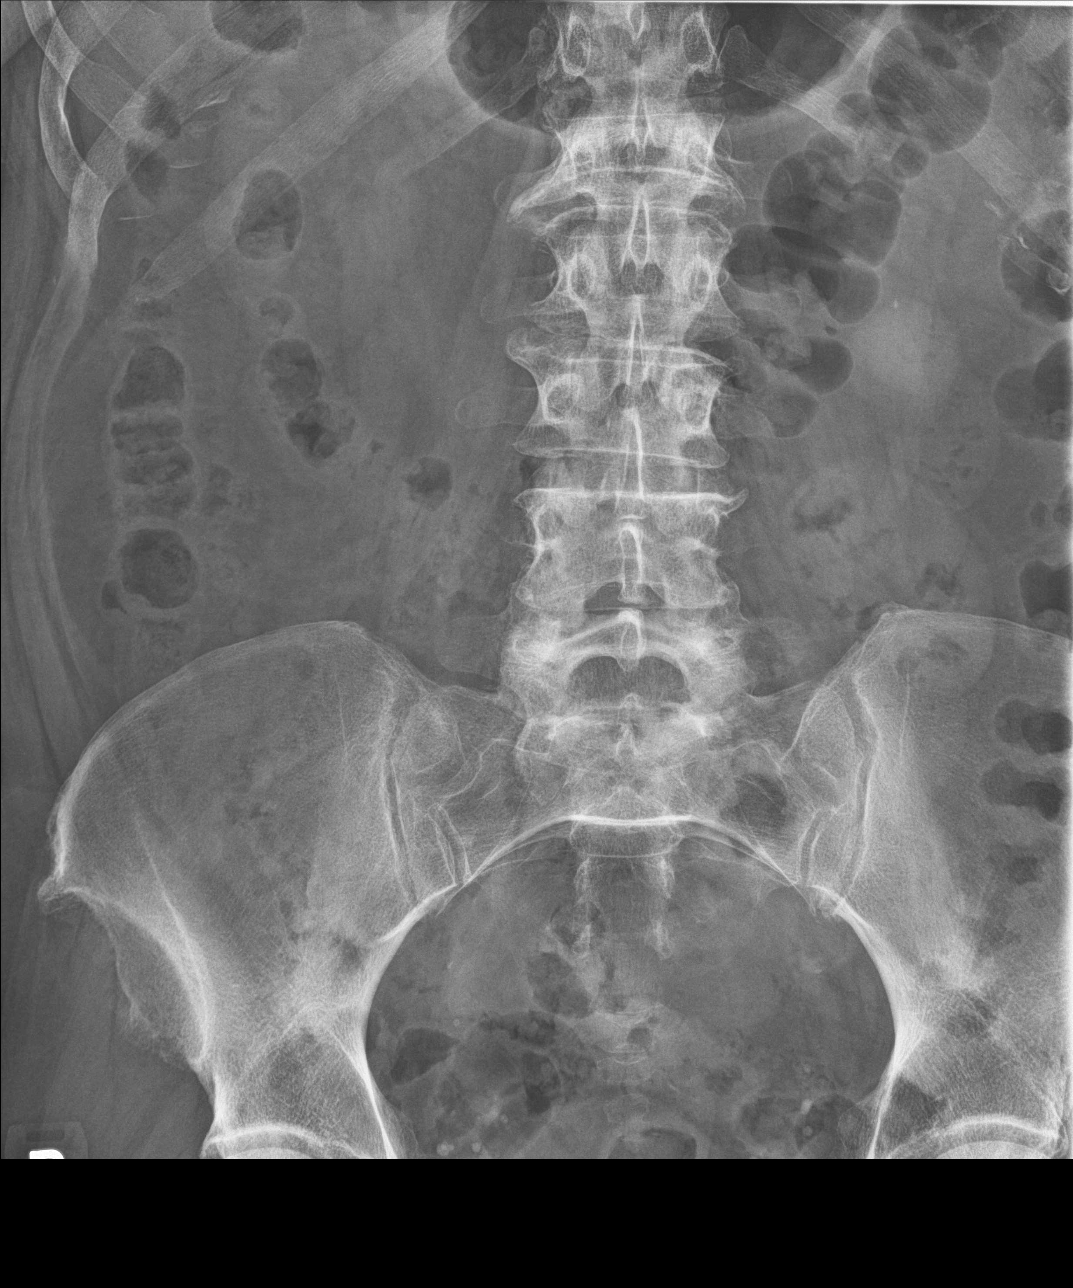

[abdomen kub (2 of 3)]
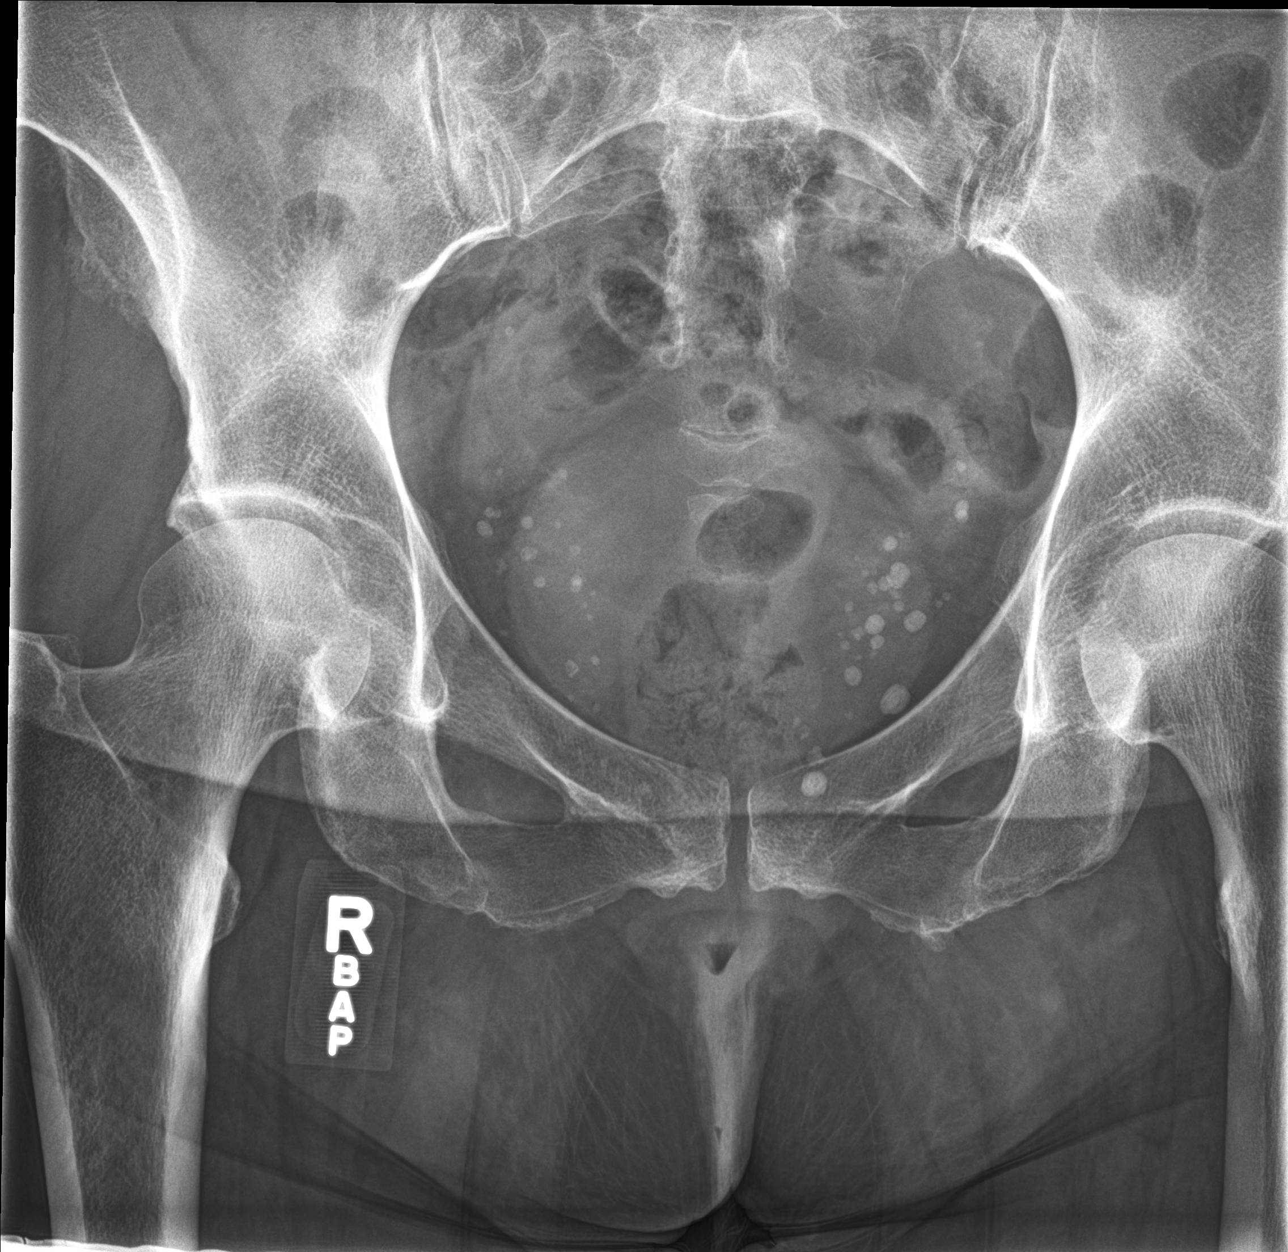

[abdomen kub (3 of 3)]
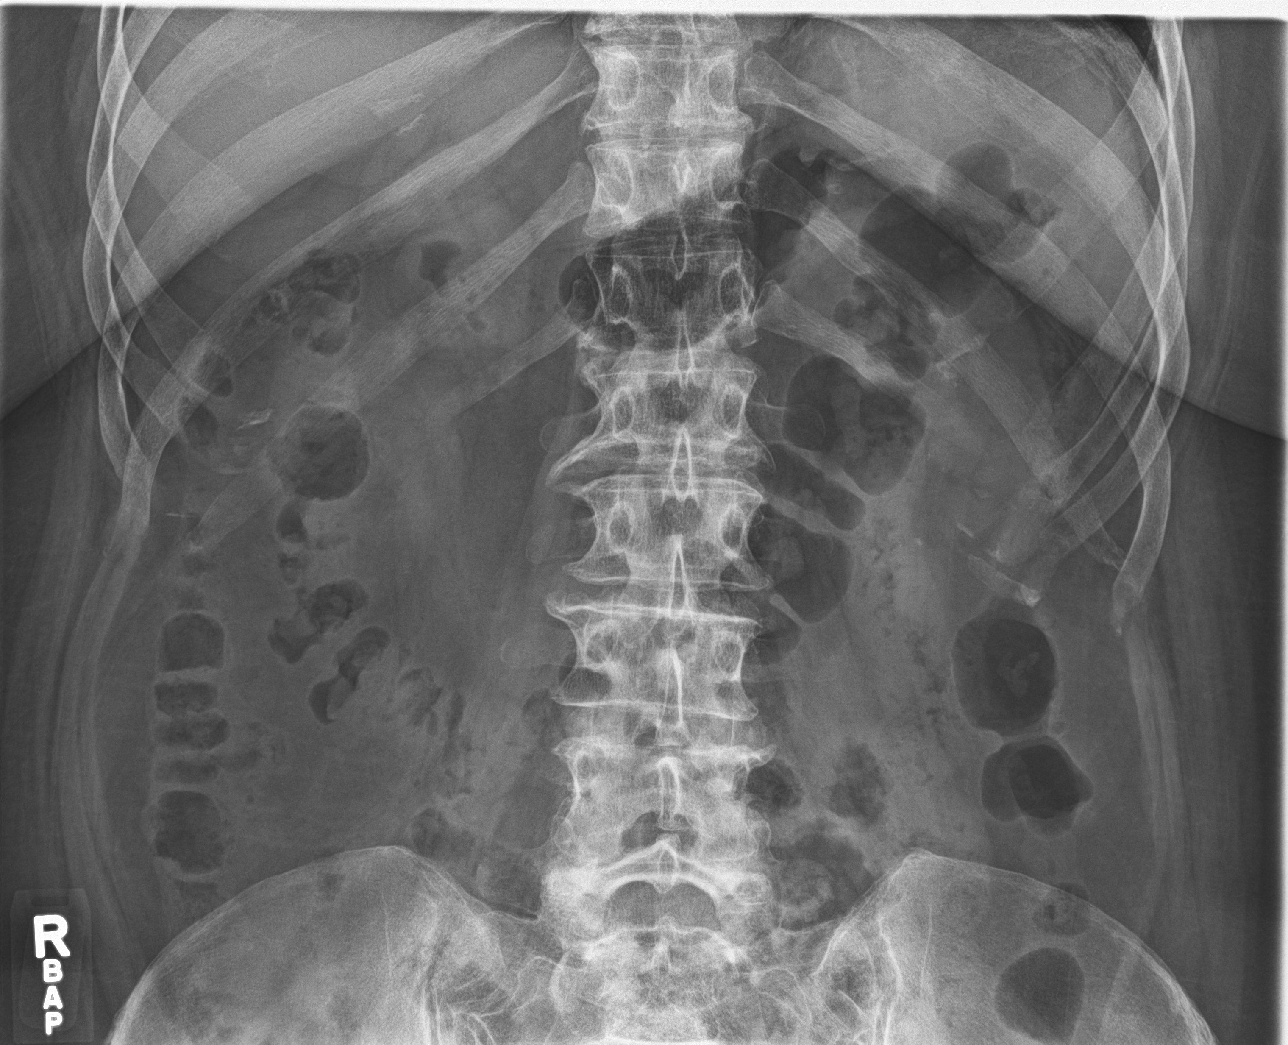

[3 of 3 positions shown; findings below may reference images not displayed]

FINDINGS: The bowel gas pattern is normal. 2 mm lower pole left kidney stone
is identified. Pelvic phleboliths are noted. Degenerative joint
changes of the spine are noted.
IMPRESSION: 2 mm lower pole left kidney stone.

## 2024-05-28 ENCOUNTER — Ambulatory Visit: Payer: Self-pay

## 2024-05-28 DIAGNOSIS — K5730 Diverticulosis of large intestine without perforation or abscess without bleeding: Secondary | ICD-10-CM | POA: Diagnosis not present

## 2024-05-28 DIAGNOSIS — Z83719 Family history of colon polyps, unspecified: Secondary | ICD-10-CM | POA: Diagnosis not present

## 2024-05-28 DIAGNOSIS — Z1211 Encounter for screening for malignant neoplasm of colon: Secondary | ICD-10-CM | POA: Diagnosis present

## 2024-05-28 DIAGNOSIS — K640 First degree hemorrhoids: Secondary | ICD-10-CM | POA: Diagnosis not present
